# Patient Record
Sex: Female | Born: 1955 | Hispanic: Yes | Marital: Married
Health system: Southern US, Community
[De-identification: ages and names within clinical notes are randomized; demographics above are authoritative.]

## PROBLEM LIST (undated history)

## (undated) DIAGNOSIS — M1990 Unspecified osteoarthritis, unspecified site: Secondary | ICD-10-CM

## (undated) DIAGNOSIS — I10 Essential (primary) hypertension: Secondary | ICD-10-CM

## (undated) DIAGNOSIS — F339 Major depressive disorder, recurrent, unspecified: Secondary | ICD-10-CM

## (undated) DIAGNOSIS — K589 Irritable bowel syndrome without diarrhea: Secondary | ICD-10-CM

## (undated) DIAGNOSIS — N951 Menopausal and female climacteric states: Secondary | ICD-10-CM

## (undated) DIAGNOSIS — K219 Gastro-esophageal reflux disease without esophagitis: Secondary | ICD-10-CM

## (undated) DIAGNOSIS — M65331 Trigger finger, right middle finger: Secondary | ICD-10-CM

## (undated) HISTORY — PX: CHOLECYSTECTOMY: SHX55

---

## 2008-12-06 ENCOUNTER — Ambulatory Visit: Payer: Self-pay | Admitting: Family Medicine

## 2010-09-13 ENCOUNTER — Ambulatory Visit: Payer: Self-pay | Admitting: Family

## 2011-11-20 ENCOUNTER — Ambulatory Visit: Payer: Self-pay

## 2015-08-30 ENCOUNTER — Encounter: Payer: Self-pay | Admitting: *Deleted

## 2015-08-30 ENCOUNTER — Ambulatory Visit: Payer: Self-pay | Attending: Oncology | Admitting: *Deleted

## 2015-08-30 ENCOUNTER — Ambulatory Visit
Admission: RE | Admit: 2015-08-30 | Discharge: 2015-08-30 | Disposition: A | Discharge status: Home / Self Care | Payer: Self-pay | Source: Ambulatory Visit | Attending: Oncology | Admitting: Oncology

## 2015-08-30 VITALS — BP 154/87 | HR 58 | Temp 96.5°F | Ht <= 58 in | Wt 118.2 lb

## 2015-08-30 DIAGNOSIS — Z0000 Encounter for general adult medical examination without abnormal findings: Secondary | ICD-10-CM

## 2015-08-30 DIAGNOSIS — Z1231 Encounter for screening mammogram for malignant neoplasm of breast: Secondary | ICD-10-CM | POA: Insufficient documentation

## 2015-08-30 NOTE — Progress Notes (Signed)
Subjective:     Patient ID: Sherri Mason, female   DOB: 01/07/1956, 59 y.o.   MRN: [id number]  HPI   Review of Systems     Objective:   Physical Exam  Pulmonary/Chest: Right breast exhibits inverted nipple. Right breast exhibits no mass, no nipple discharge, no skin change and no tenderness. Left breast exhibits inverted nipple. Left breast exhibits no mass, no nipple discharge, no skin change and no tenderness. Breasts are symmetrical.  Bilateral nipples are inverted.  Patient states this is normal for her       Assessment:     59 year [facility] Hispanic [facility]  Damian Leavellrudy, the interpreter is present during the interview and exam.  Clinical breast exam unremarkable.  Taught self breast awareness. Patient has been screened for eligibility.  Blood pressure elevated at 157/87.  She is to recheck her blood pressure at [facility] or [facility], and if remains higher than 140/90 she is to follow-up with her primary care provider.  Hand out on hypertention given to patient.She does not have any insurance, Medicare or Medicaid.  She also meets financial eligibility.  Hand-out given on the Affordable Care Act.     Plan:     Screening mammogram ordered.  Will follow up per [facility] protocol.

## 2015-09-04 ENCOUNTER — Encounter: Payer: Self-pay | Admitting: *Deleted

## 2015-09-04 NOTE — Progress Notes (Signed)
Letter mailed to inform patient of her normal mammogram.  To follow-up in one year with next screening.  [facility] [place].

## 2015-09-13 ENCOUNTER — Ambulatory Visit: Payer: Self-pay

## 2015-12-12 ENCOUNTER — Other Ambulatory Visit: Payer: Self-pay | Admitting: Primary Care

## 2015-12-12 DIAGNOSIS — R109 Unspecified abdominal pain: Secondary | ICD-10-CM

## 2015-12-15 ENCOUNTER — Ambulatory Visit
Admission: RE | Admit: 2015-12-15 | Discharge: 2015-12-15 | Disposition: A | Discharge status: Home / Self Care | Payer: BLUE CROSS/BLUE SHIELD | Source: Ambulatory Visit | Attending: Primary Care | Admitting: Primary Care

## 2015-12-15 DIAGNOSIS — R1010 Upper abdominal pain, unspecified: Secondary | ICD-10-CM | POA: Insufficient documentation

## 2015-12-15 DIAGNOSIS — R109 Unspecified abdominal pain: Secondary | ICD-10-CM

## 2015-12-15 DIAGNOSIS — Z9049 Acquired absence of other specified parts of digestive tract: Secondary | ICD-10-CM | POA: Diagnosis not present

## 2017-01-22 ENCOUNTER — Ambulatory Visit: Admit: 2017-01-22 | Payer: BLUE CROSS/BLUE SHIELD | Admitting: Unknown Physician Specialty

## 2017-01-22 SURGERY — EGD (ESOPHAGOGASTRODUODENOSCOPY)
Anesthesia: General

## 2019-05-24 ENCOUNTER — Telehealth: Payer: Self-pay | Admitting: *Deleted

## 2019-05-24 DIAGNOSIS — Z20822 Contact with and (suspected) exposure to covid-19: Secondary | ICD-10-CM

## 2019-05-24 NOTE — Addendum Note (Signed)
Addended by: Dimple Nanas on: 05/24/2019 01:57 PM   Modules accepted: Orders

## 2019-05-24 NOTE — Telephone Encounter (Signed)
Pt called using [employer] ID# [id number]. Pt scheduled for appt on 05/25/19 at [facility] site. Pt advised to wear a mask and to remain in the car at scheduled appt time. Understanding verbalized.

## 2019-05-24 NOTE — Telephone Encounter (Signed)
Pt referred for COVID-19 testing by [facility].  

## 2019-05-25 ENCOUNTER — Other Ambulatory Visit: Payer: BLUE CROSS/BLUE SHIELD

## 2019-05-25 DIAGNOSIS — Z20822 Contact with and (suspected) exposure to covid-19: Secondary | ICD-10-CM

## 2019-05-29 LAB — NOVEL CORONAVIRUS, NAA: SARS-CoV-2, NAA: DETECTED — AB

## 2019-06-04 ENCOUNTER — Emergency Department: Payer: PRIVATE HEALTH INSURANCE

## 2019-06-04 ENCOUNTER — Encounter: Payer: Self-pay | Admitting: Emergency Medicine

## 2019-06-04 ENCOUNTER — Encounter (HOSPITAL_COMMUNITY): Payer: Self-pay | Admitting: Family Medicine

## 2019-06-04 ENCOUNTER — Other Ambulatory Visit: Payer: Self-pay

## 2019-06-04 ENCOUNTER — Emergency Department
Admission: EM | Admit: 2019-06-04 | Discharge: 2019-06-04 | Payer: PRIVATE HEALTH INSURANCE | Attending: Emergency Medicine | Admitting: Emergency Medicine

## 2019-06-04 ENCOUNTER — Inpatient Hospital Stay (HOSPITAL_COMMUNITY)
Admission: AD | Admit: 2019-06-04 | Discharge: 2019-06-09 | DRG: 177 | Disposition: A | Discharge status: Home / Self Care | Payer: PRIVATE HEALTH INSURANCE | Source: Other Acute Inpatient Hospital | Attending: Internal Medicine | Admitting: Internal Medicine

## 2019-06-04 DIAGNOSIS — U071 COVID-19: Secondary | ICD-10-CM | POA: Diagnosis present

## 2019-06-04 DIAGNOSIS — J9621 Acute and chronic respiratory failure with hypoxia: Secondary | ICD-10-CM | POA: Diagnosis not present

## 2019-06-04 DIAGNOSIS — J1289 Other viral pneumonia: Secondary | ICD-10-CM | POA: Diagnosis present

## 2019-06-04 DIAGNOSIS — J80 Acute respiratory distress syndrome: Secondary | ICD-10-CM | POA: Diagnosis present

## 2019-06-04 DIAGNOSIS — F419 Anxiety disorder, unspecified: Secondary | ICD-10-CM | POA: Diagnosis present

## 2019-06-04 DIAGNOSIS — J9601 Acute respiratory failure with hypoxia: Secondary | ICD-10-CM

## 2019-06-04 DIAGNOSIS — R0602 Shortness of breath: Secondary | ICD-10-CM | POA: Diagnosis present

## 2019-06-04 LAB — COMPREHENSIVE METABOLIC PANEL
ALT: 22 U/L (ref 0–44)
AST: 34 U/L (ref 15–41)
Albumin: 3.7 g/dL (ref 3.5–5.0)
Alkaline Phosphatase: 64 U/L (ref 38–126)
Anion gap: 10 (ref 5–15)
BUN: 8 mg/dL (ref 6–20)
CO2: 23 mmol/L (ref 22–32)
Calcium: 9.4 mg/dL (ref 8.9–10.3)
Chloride: 109 mmol/L (ref 98–111)
Creatinine, Ser: 0.46 mg/dL (ref 0.44–1.00)
GFR calc Af Amer: >60 mL/min (ref >60)
GFR calc non Af Amer: >60 mL/min (ref >60)
Glucose, Bld: 120 mg/dL — ABNORMAL HIGH (ref 70–99)
Potassium: 3.8 mmol/L (ref 3.5–5.1)
Sodium: 142 mmol/L (ref 135–145)
Total Bilirubin: 0.6 mg/dL (ref 0.3–1.2)
Total Protein: 7.4 g/dL (ref 6.5–8.1)

## 2019-06-04 LAB — CBC WITH DIFFERENTIAL/PLATELET
Abs Immature Granulocytes: 0.05 10*3/uL (ref 0.00–0.07)
Basophils Absolute: 0 10*3/uL (ref 0.0–0.1)
Basophils Relative: 0 %
Eosinophils Absolute: 0 10*3/uL (ref 0.0–0.5)
Eosinophils Relative: 0 %
HCT: 37.3 % (ref 36.0–46.0)
Hemoglobin: 12.6 g/dL (ref 12.0–15.0)
Immature Granulocytes: 1 %
Lymphocytes Relative: 13 %
Lymphs Abs: 0.9 10*3/uL (ref 0.7–4.0)
MCH: 30.4 pg (ref 26.0–34.0)
MCHC: 33.8 g/dL (ref 30.0–36.0)
MCV: 89.9 fL (ref 80.0–100.0)
Monocytes Absolute: 0.5 10*3/uL (ref 0.1–1.0)
Monocytes Relative: 7 %
Neutro Abs: 5.2 10*3/uL (ref 1.7–7.7)
Neutrophils Relative %: 79 %
Platelets: 360 10*3/uL (ref 150–400)
RBC: 4.15 MIL/uL (ref 3.87–5.11)
RDW: 12.2 % (ref 11.5–15.5)
WBC: 6.7 10*3/uL (ref 4.0–10.5)
nRBC: 0 % (ref 0.0–0.2)

## 2019-06-04 LAB — ABO/RH: ABO/RH(D): O POS

## 2019-06-04 LAB — FERRITIN: Ferritin: 790 ng/mL — ABNORMAL HIGH (ref 11–307)

## 2019-06-04 LAB — BRAIN NATRIURETIC PEPTIDE: B Natriuretic Peptide: 46.2 pg/mL (ref 0.0–100.0)

## 2019-06-04 LAB — D-DIMER, QUANTITATIVE: D-Dimer, Quant: 1.01 ug/mL-FEU — ABNORMAL HIGH (ref 0.00–0.50)

## 2019-06-04 LAB — LACTIC ACID, PLASMA
Lactic Acid, Venous: 1 mmol/L (ref 0.5–1.9)
Lactic Acid, Venous: 1 mmol/L (ref 0.5–1.9)

## 2019-06-04 LAB — FIBRIN DERIVATIVES D-DIMER (ARMC ONLY): Fibrin derivatives D-dimer (ARMC): 953.04 ng/mL (FEU) — ABNORMAL HIGH (ref 0.00–499.00)

## 2019-06-04 LAB — C-REACTIVE PROTEIN
CRP: 7.4 mg/dL — ABNORMAL HIGH (ref <1.0)
CRP: 8 mg/dL — ABNORMAL HIGH (ref <1.0)

## 2019-06-04 LAB — TRIGLYCERIDES: Triglycerides: 218 mg/dL — ABNORMAL HIGH (ref <150)

## 2019-06-04 LAB — LACTATE DEHYDROGENASE: LDH: 215 U/L — ABNORMAL HIGH (ref 98–192)

## 2019-06-04 LAB — PROCALCITONIN
Procalcitonin: <0.1 ng/mL
Procalcitonin: <0.1 ng/mL

## 2019-06-04 LAB — FIBRINOGEN: Fibrinogen: 651 mg/dL — ABNORMAL HIGH (ref 210–475)

## 2019-06-04 MED ORDER — ENOXAPARIN SODIUM 40 MG/0.4ML ~~LOC~~ SOLN
40.0000 mg | SUBCUTANEOUS | Status: DC
  Administered 2019-06-04 – 2019-06-08 (×5): 40 mg via SUBCUTANEOUS
  Filled 2019-06-04 (×5): qty 0.4

## 2019-06-04 MED ORDER — BISACODYL 5 MG PO TBEC: 5.0000 mg | DELAYED_RELEASE_TABLET | Freq: Every day | ORAL | Status: DC | PRN

## 2019-06-04 MED ORDER — HYDROCOD POLST-CPM POLST ER 10-8 MG/5ML PO SUER: 5.0000 mL | Freq: Two times a day (BID) | ORAL | Status: DC | PRN

## 2019-06-04 MED ORDER — SODIUM CHLORIDE 0.9 % IV SOLN
200.0000 mg | Freq: Once | INTRAVENOUS | Status: AC
  Administered 2019-06-04: 200 mg via INTRAVENOUS
  Filled 2019-06-04: qty 40

## 2019-06-04 MED ORDER — ALPRAZOLAM 0.5 MG PO TABS
0.5000 mg | ORAL_TABLET | Freq: Three times a day (TID) | ORAL | Status: DC | PRN
  Administered 2019-06-04 – 2019-06-05 (×3): 0.5 mg via ORAL
  Filled 2019-06-04 (×3): qty 1

## 2019-06-04 MED ORDER — IPRATROPIUM-ALBUTEROL 20-100 MCG/ACT IN AERS
1.0000 | INHALATION_SPRAY | Freq: Four times a day (QID) | RESPIRATORY_TRACT | Status: DC
  Administered 2019-06-04 – 2019-06-09 (×20): 1 via RESPIRATORY_TRACT
  Filled 2019-06-04: qty 4

## 2019-06-04 MED ORDER — TOCILIZUMAB 400 MG/20ML IV SOLN
8.0000 mg/kg | Freq: Once | INTRAVENOUS | Status: AC
  Administered 2019-06-04: 400 mg via INTRAVENOUS
  Filled 2019-06-04: qty 20

## 2019-06-04 MED ORDER — DEXAMETHASONE SODIUM PHOSPHATE 10 MG/ML IJ SOLN
6.0000 mg | Freq: Once | INTRAMUSCULAR | Status: AC
  Administered 2019-06-04: 6 mg via INTRAVENOUS
  Filled 2019-06-04: qty 1

## 2019-06-04 MED ORDER — GUAIFENESIN-DM 100-10 MG/5ML PO SYRP
10.0000 mL | ORAL_SOLUTION | Freq: Four times a day (QID) | ORAL | Status: DC
  Administered 2019-06-04 – 2019-06-08 (×14): 10 mL via ORAL
  Filled 2019-06-04 (×15): qty 10

## 2019-06-04 MED ORDER — VITAMIN C 500 MG PO TABS
500.0000 mg | ORAL_TABLET | Freq: Every day | ORAL | Status: DC
  Administered 2019-06-04 – 2019-06-09 (×6): 500 mg via ORAL
  Filled 2019-06-04 (×6): qty 1

## 2019-06-04 MED ORDER — ONDANSETRON HCL 4 MG/2ML IJ SOLN: 4.0000 mg | Freq: Four times a day (QID) | INTRAMUSCULAR | Status: DC | PRN

## 2019-06-04 MED ORDER — ZINC SULFATE 220 (50 ZN) MG PO CAPS
220.0000 mg | ORAL_CAPSULE | Freq: Every day | ORAL | Status: DC
  Administered 2019-06-04 – 2019-06-09 (×6): 220 mg via ORAL
  Filled 2019-06-04 (×6): qty 1

## 2019-06-04 MED ORDER — METHYLPREDNISOLONE SODIUM SUCC 40 MG IJ SOLR
40.0000 mg | Freq: Three times a day (TID) | INTRAMUSCULAR | Status: DC
  Administered 2019-06-04 – 2019-06-05 (×2): 40 mg via INTRAVENOUS
  Filled 2019-06-04 (×2): qty 1

## 2019-06-04 MED ORDER — TRAZODONE HCL 50 MG PO TABS: 50.0000 mg | ORAL_TABLET | Freq: Every evening | ORAL | Status: DC | PRN

## 2019-06-04 MED ORDER — ACETAMINOPHEN 325 MG PO TABS
650.0000 mg | ORAL_TABLET | Freq: Four times a day (QID) | ORAL | Status: DC | PRN
  Administered 2019-06-04 – 2019-06-05 (×3): 650 mg via ORAL
  Filled 2019-06-04 (×3): qty 2

## 2019-06-04 MED ORDER — HYDROCODONE-ACETAMINOPHEN 5-325 MG PO TABS: 1.0000 | ORAL_TABLET | ORAL | Status: DC | PRN

## 2019-06-04 MED ORDER — GUAIFENESIN-DM 100-10 MG/5ML PO SYRP: 10.0000 mL | ORAL_SOLUTION | ORAL | Status: DC | PRN

## 2019-06-04 MED ORDER — SENNOSIDES-DOCUSATE SODIUM 8.6-50 MG PO TABS: 1.0000 | ORAL_TABLET | Freq: Every evening | ORAL | Status: DC | PRN

## 2019-06-04 MED ORDER — DOCUSATE SODIUM 100 MG PO CAPS
100.0000 mg | ORAL_CAPSULE | Freq: Two times a day (BID) | ORAL | Status: DC
  Administered 2019-06-04 – 2019-06-09 (×10): 100 mg via ORAL
  Filled 2019-06-04 (×10): qty 1

## 2019-06-04 MED ORDER — ONDANSETRON HCL 4 MG PO TABS: 4.0000 mg | ORAL_TABLET | Freq: Four times a day (QID) | ORAL | Status: DC | PRN

## 2019-06-04 MED ORDER — SODIUM CHLORIDE 0.9 % IV SOLN
100.0000 mg | INTRAVENOUS | Status: AC
  Administered 2019-06-05 – 2019-06-08 (×4): 100 mg via INTRAVENOUS
  Filled 2019-06-04 (×4): qty 20

## 2019-06-04 NOTE — ED Provider Notes (Signed)
[facility] Emergency Department Provider Note   ____________________________________________    I have reviewed the triage vital signs and the nursing notes.   HISTORY  Chief Complaint Shortness of Breath and Chest Pain     HPI Sherri Mason is a 63 y.o. female with known COVID-19 who presents with worsening shortness of breath, chest discomfort.  Patient reports that she is feeling increasing shortness of breath, feels very weak and has no one to take care of her at home.  Was seen at [facility] emergency department twice in the last 4 days per records.  She notes that she is getting weaker every day.  Was diagnosed with novel coronavirus on the 11th  History reviewed. No pertinent past medical history.  There are no active problems to display for this patient.   History reviewed. No pertinent surgical history.  Prior to Admission medications   Not on File     Allergies Patient has no known allergies.  Family History  Problem Relation Age of Onset  . Breast cancer Neg Hx     Social History Does not smoke, no alcohol use Review of Systems  Constitutional: Positive fevers Eyes: No visual changes.  ENT: Mouth feels dry Cardiovascular: Chest tightness Respiratory: As above Gastrointestinal: No abdominal pain.  No nausea, no vomiting.   Genitourinary: Negative for dysuria. Musculoskeletal: Negative for back pain. Skin: Negative for rash. Neurological: Negative for headaches or weakness   ____________________________________________   PHYSICAL EXAM:  VITAL SIGNS: ED Triage Vitals  Enc Vitals Group     BP 06/04/19 1343 (!) 153/77     Pulse Rate 06/04/19 1343 93     Resp 06/04/19 1343 (!) 26     Temp 06/04/19 1343 99.3 F (37.4 C)     Temp Source 06/04/19 1343 Oral     SpO2 06/04/19 1343 93 %     Weight 06/04/19 1345 49.9 kg (110 lb)     Height 06/04/19 1345 1.524 m (5')     Head Circumference --      Peak Flow --       Pain Score 06/04/19 1345 9     Pain Loc --      Pain Edu? --      Excl. in [place]? --     Constitutional: Alert and oriented.  Eyes: Conjunctivae are normal.   Nose: No congestion/rhinnorhea. Mouth/Throat: Mucous membranes are moist.    Cardiovascular: Normal rate, regular rhythm. Grossly normal heart sounds.  Good peripheral circulation. Respiratory: Tachypnea, no retractions.  Rales bilateral Gastrointestinal: Soft and nontender. No distention.  No CVA tenderness.  Musculoskeletal: No lower extremity tenderness nor edema.  Warm and well perfused Neurologic:  Normal speech and language. No gross focal neurologic deficits are appreciated.  Skin:  Skin is warm, dry and intact. No rash noted. Psychiatric: Mood and affect are normal. Speech and behavior are normal.  ____________________________________________   LABS (all labs ordered are listed, but only abnormal results are displayed)  Labs Reviewed  CULTURE, BLOOD (ROUTINE X 2)  CULTURE, BLOOD (ROUTINE X 2)  LACTIC ACID, PLASMA  LACTIC ACID, PLASMA  CBC WITH DIFFERENTIAL/PLATELET  COMPREHENSIVE METABOLIC PANEL  FIBRIN DERIVATIVES D-DIMER ([facility] ONLY)  PROCALCITONIN  LACTATE DEHYDROGENASE  FERRITIN  TRIGLYCERIDES  FIBRINOGEN  C-REACTIVE PROTEIN  POC URINE PREG, ED   ____________________________________________  EKG   ____________________________________________  RADIOLOGY  Chest x-ray demonstrates bilateral patchy airspace opacities, viewed by me ____________________________________________   PROCEDURES  Procedure(s) performed: No  Procedures   Critical Care performed: yes  CRITICAL CARE Performed by: Jene Everyobert Christin M[place]   Total critical care time: 30 minutes  Critical care time was exclusive of separately billable procedures and treating other patients.  Critical care was necessary to treat or prevent imminent or life-threatening deterioration.  Critical care was time spent personally by me on the  following activities: development of treatment plan with patient and/or surrogate as well as nursing, discussions with consultants, evaluation of patient's response to treatment, examination of patient, obtaining history from patient or surrogate, ordering and performing treatments and interventions, ordering and review of laboratory studies, ordering and review of radiographic studies, pulse oximetry and re-evaluation of patient's condition.  ____________________________________________   INITIAL IMPRESSION / ASSESSMENT AND PLAN / ED COURSE  Pertinent labs & imaging results that were available during my care of the patient were reviewed by me and considered in my medical decision making (see chart for details).  Patient presents with worsening shortness of breath with a diagnosis of COVID-19.  Chest x-ray appears to be worsening, she becomes  hypoxic to 86% with any ambulation and short of breath.  While lying in bed she is satting in the low 90s but is markedly tachypneic.  Improves with O2.  Has little support at home.  I suspect she will require admission given her oxygen requirement   [facility]el [facility], accepts transfer.    ____________________________________________   FINAL CLINICAL IMPRESSION(S) / ED DIAGNOSES  Final diagnoses:  COVID-19  Acute respiratory failure with hypoxia [facility](HCC)        Note:  This document was prepared using Dragon voice recognition software and may include unintentional dictation errors.   Jene EveryKinner, Anelise Staron, MD 06/04/19 1451

## 2019-06-04 NOTE — H&P (Signed)
History and Physical   Patient: Sherri Mason                            PCP: Sandrea Hughsubio, Jessica, NP                    DOB: 06/22/1956            DOA: 06/04/2019 ZOX:[id number]             DOS: 06/04/2019, 7:44 PM  Patient coming from:   Home, answer from [place] I have personally reviewed patient's medical records, in electronic medical records, including: [place] [facility], and care everywhere.    Chief Complaint:   Shortness of breath, hypoxia  History of present illness:    Bennett Sherri Mason Mason is a 63 y.o. female with no significant past medical history  Presented in [place] in acute respiratory distress hypoxic due to COVID 19 infection.  Patient had a positive COVID-19 infection, has been apparently [facility] for respiratory distress per records did not meet the criteria for admission.  Patient's initial positive COVID-19 test was on the July 11th, 2020, She has been progressively getting worse with shortness of breath.  [facility] at [facility] patient was seen again was satting 86% on room air and worsening with exertion tachypneic with aspiratory rate 30s-40s.  Improved to 92% on 2 L by nasal cannula.   Patient reporting of subjective fever, chills, dry cough, shortness of breath.   Patient Denies having: Chest Pain, Abd pain, N/V/D, headache, dizziness, lightheadedness, joint pain, rash, open wounds    ED Course:   Patient was [facility] at [facility] ED, was satting 86% on room air, improved to low 90s, 92% with 2 L of oxygen, apparently was de-satting with further exertion. CBC CMP were obtained within normal limits LDH 215, triglyceride 218, ferritin 790, lactic acid 1.0, procalcitonin level <0.10 x2, BBC 6.7, fibrinogen 651, d-dimer 953.04 SARS-CoV-2, NAA Not Detected Detected Abnormal     Blood cultures were obtained at [place]  Review of Systems: As per HPI, otherwise 10 point review of systems were negative.    ----------------------------------------------------------------------------------------------------------------------  No Known Allergies  Home MEDs:  Prior to Admission medications   Not on File    PRN MEDs: acetaminophen, bisacodyl, chlorpheniramine-HYDROcodone, HYDROcodone-acetaminophen, ondansetron **OR** ondansetron (ZOFRAN) IV, senna-docusate, traZODone  History reviewed. No pertinent past medical history.  History reviewed. No pertinent surgical history.   has no history on file for tobacco, alcohol, and drug.   Family History  Problem Relation Age of Onset  . Breast cancer Neg Hx     Physical Exam:   Vitals:   06/04/19 1922  BP: (!) 158/98  Pulse: 74  Resp: (!) 35  Temp: 98.6 F (37 C)  TempSrc: Oral  SpO2: 98%   Constitutional: Mild distress shortness of breath, cough on O2 via nasal cannula high flow Eyes: PERRL, lids and conjunctivae normal ENMT: Mucous membranes are moist. Posterior pharynx clear of any exudate or lesions.Normal dentition.  Neck: normal, supple, no masses, no thyromegaly Respiratory: Bilateral rhonchi few's mild-moderate wheezing, no crackles.  Increased respiratory effort.  To moderate use of accessory muscle use.  Cardiovascular: Regular rate and rhythm, no murmurs / rubs / gallops. No extremity edema. 2+ pedal pulses. No carotid bruits.  Abdomen: no tenderness, no masses palpated. No hepatosplenomegaly. Bowel sounds positive.  Musculoskeletal: no clubbing / cyanosis. No joint deformity upper and lower extremities. Good ROM,  no contractures. Normal muscle tone.  Neurologic: CN II-XII grossly intact. Sensation intact, DTR normal. Strength 5/5 in all 4.  Psychiatric: Normal judgment and insight. Alert and oriented x 3. Normal mood.  Skin: no rashes, lesions, ulcers. No induration   Labs on admission:    I have personally reviewed following labs and imaging studies  CBC: Recent Labs  Lab 06/04/19 1408  WBC 6.7  NEUTROABS 5.2  HGB  12.6  HCT 37.3  MCV 89.9  PLT 789   Basic Metabolic Panel: Recent Labs  Lab 06/04/19 1408  NA 142  K 3.8  CL 109  CO2 23  GLUCOSE 120*  BUN 8  CREATININE 0.46  CALCIUM 9.4   GFR: Estimated Creatinine Clearance: 53.7 mL/min (by C-G formula based on SCr of 0.46 mg/dL). Liver Function Tests: Recent Labs  Lab 06/04/19 1408  AST 34  ALT 22  ALKPHOS 64  BILITOT 0.6  PROT 7.4  ALBUMIN 3.7  Lipid Profile: Recent Labs    06/04/19 1408  TRIG 218*   Thyroid Function Tests: No results for input(s): TSH, T4TOTAL, FREET4, T3FREE, THYROIDAB in the last 72 hours. Anemia Panel: Recent Labs    06/04/19 1408  FERRITIN 790*   Urine analysis: No results found for: COLORURINE, APPEARANCEUR, LABSPEC, PHURINE, GLUCOSEU, HGBUR, BILIRUBINUR, KETONESUR, PROTEINUR, UROBILINOGEN, NITRITE, LEUKOCYTESUR   Radiologic Exams on Admission:   Dg Chest Port 1 View  Result Date: 06/04/2019 CLINICAL DATA:  Shortness of breath. EXAM: PORTABLE CHEST 1 VIEW COMPARISON:  None. FINDINGS: The heart size and mediastinal contours are within normal limits. No pneumothorax or pleural effusion is noted. Multiple patchy airspace opacities are noted throughout both lungs. The visualized skeletal structures are unremarkable. IMPRESSION: Bilateral patchy airspace opacities are noted consistent with multifocal pneumonia. Electronically Signed   By: Marijo Conception M.D.   On: 06/04/2019 14:20    EKG:   Independently reviewed.   Orders placed or performed during the hospital encounter of 06/04/19  . ED EKG 12-Lead  . ED EKG 12-Lead   --------------------------------------------------------------------------------------------------------------------------    Assessment / Plan:   Principal Problem:   Acute respiratory distress syndrome (ARDS) due to COVID-19 virus Active Problems:   COVID-19 virus infection    Acute respiratory distress syndrome (ARDS) due to COVID-19 virus -Continue supplement  oxygen, high flow, to maintain O2 sat greater than 92% -LDH 215, triglyceride 218, ferritin 790, lactic acid 1.0, procalcitonin level <0.10 x2, BBC 6.7, fibrinogen 651, d-dimer 953.04 -We will continue to monitor markers LDH, ferritin, C-reactive protein, -Patient meets the criteria for Ramdesivire and Actemra -we will consult pharmacy to recheck the criteria and initiate dosage -Initiating IV steroids, continue scheduled inhalers, and PRN   ??  Pneumonia -Chest x-rays were reviewed -05/1419 from [facility] patient was treated with Levaquin 750 mg x 5 days -Patient's procalcitonin level is within normal limits, will repeating, chest x-ray reporting pneumonia but patient has no, fever, no  leukocytosis -will abstain from antibiotic use at this time.  Anxiety -Likely due to respiratory distress -Continue to monitor, PRN Xanax   DVT prophylaxis: SCD/Compression stockings and Heparin SQ  Code Status:   Code Status: Full Code  Family Communication:  The above findings and plan of care has been discussed with patient and family in detail, they expressed understanding and agreement of above plan.   Called the patient's daughter Ms. Laqueta Jean, at [phone] confirmed patient history she is not on any medication at home no chronic issues.  Firm progressive respiratory distress, cough, recent  medication for possible pneumonia was completed. Patient is confirmed non-smoker does not drink.  No chronic medical issues  Above finding plan of care was discussed with her in detail.  She has expressed understanding and agreement above plan.  Disposition Plan: >5 days  Consults called:  None  Admission status: Patient will be admitted as Inpatient, with a greater than 2 midnight length of stay.   Cultures:  -Sugars were obtained and elements Antimicrobial: -None   ---------------------------------------------------------------------------------------------------------------------------------------------------------------------------------------------------------------------------------------  Time spent: > than  55  Min.   SIGNED: Kendell BaneSeyed A Thomasa Heidler, MD, FACP, FHM. [facility],  Pager [phone]  If 7PM-7AM, please contact night-coverage [email or web address], Password [email or web address] 06/04/2019, 7:44 PM

## 2019-06-04 NOTE — ED Notes (Signed)
Pt transferred to [place] by [facility].

## 2019-06-04 NOTE — ED Notes (Signed)
EMTALA Reviewed 

## 2019-06-04 NOTE — Progress Notes (Signed)
Pharmacy Note - Remdesivir Dosing  O:  ALT: 22 CXR: Bilateral patchy airspace opacities c/w multifocal pneumonia. SpO2: initially 86% on RA, now 98% on 2L   A/P:  Patient meets criteria for remdesivir.  Begin remdesivir 200 mg IV x 1, followed by 100 mg IV daily x 4 days  Monitor ALT, clinical progress  Peggyann Juba, PharmD, [place] [phone] 06/04/2019 7:52 PM

## 2019-06-04 NOTE — ED Triage Notes (Signed)
PT arrives with increased shortness of breath and chest pain. Pt diagnosed with COVID 19 12 days prior. PT very weak in triage. Pt respiratory rate 26-32 in triage. Room air saturation 93%.

## 2019-06-04 NOTE — ED Notes (Signed)
Pt handed this nurse the phone to speak with her daughter. Daughter questioned whether she was going to be admitted and why; also whether or not pt really needed to be admitted. Explained that pt has been in ER 3 times in last 3-4 days and she states she can't take care of herself and no one is at home to take care of her. Daughter's name and number added to contact list.

## 2019-06-04 NOTE — Progress Notes (Signed)
[facility] Plan of Care Note  Patient: Sherri Mason    VOJ:[id number]  PCP: Freddy Finner, NP    DOB: 01-23-1956  DOS: 06/04/2019   Received a phone call from Facility: [facility] ED regarding transfer of Ms. Sherri Mason. Requesting: Dr. Corky Downs Reason for transfer: COVID positive with hypoxic respiratory failure History: Recently diagnosed at [facility] ED for COVID-19 infection.  Since the patient did not have any indication for admission patient was sent home.  Came back with worsening shortness of breath and still was not making any admission criteria. Currently back in the ED with worsening fatigue shortness of breath and cough.  Per EDP hypoxic 86% on exertion.  Chest x-ray shows bilateral fluffy infiltrates.  Significantly tachypneic on examination.  Vitals: Respiratory rate in 30s and 40s.  86% on room air on exertion, 92% on 2 LPM. Labs: Negative procalcitonin COVID-19 Labs  Recent Labs    06/04/19 1408  FERRITIN 790*  LDH 215*    Lab Results  Component Value Date   SARSCOV2NAA Detected (A) 05/25/2019   CRP pending. Treatment received: IV Decadron 6 mg x 1.  Plan of care: The patient will be accepted for admission to [facility], telemetry unit.  Author: Berle Mull, MD [facility] 06/04/2019  If 7PM-7AM, please contact night-coverage at [email or web address],

## 2019-06-05 ENCOUNTER — Encounter (HOSPITAL_COMMUNITY): Payer: Self-pay | Admitting: *Deleted

## 2019-06-05 ENCOUNTER — Inpatient Hospital Stay: Payer: Self-pay

## 2019-06-05 DIAGNOSIS — J9621 Acute and chronic respiratory failure with hypoxia: Secondary | ICD-10-CM

## 2019-06-05 LAB — COMPREHENSIVE METABOLIC PANEL
ALT: 26 U/L (ref 0–44)
AST: 36 U/L (ref 15–41)
Albumin: 3.2 g/dL — ABNORMAL LOW (ref 3.5–5.0)
Alkaline Phosphatase: 60 U/L (ref 38–126)
Anion gap: 6 (ref 5–15)
BUN: 13 mg/dL (ref 6–20)
CO2: 26 mmol/L (ref 22–32)
Calcium: 9.3 mg/dL (ref 8.9–10.3)
Chloride: 107 mmol/L (ref 98–111)
Creatinine, Ser: 0.47 mg/dL (ref 0.44–1.00)
GFR calc Af Amer: >60 mL/min (ref >60)
GFR calc non Af Amer: >60 mL/min (ref >60)
Glucose, Bld: 178 mg/dL — ABNORMAL HIGH (ref 70–99)
Potassium: 3.9 mmol/L (ref 3.5–5.1)
Sodium: 139 mmol/L (ref 135–145)
Total Bilirubin: 0.3 mg/dL (ref 0.3–1.2)
Total Protein: 6.9 g/dL (ref 6.5–8.1)

## 2019-06-05 LAB — CK: Total CK: 31 U/L — ABNORMAL LOW (ref 38–234)

## 2019-06-05 LAB — CBC WITH DIFFERENTIAL/PLATELET
Abs Immature Granulocytes: 0.02 10*3/uL (ref 0.00–0.07)
Basophils Absolute: 0 10*3/uL (ref 0.0–0.1)
Basophils Relative: 0 %
Eosinophils Absolute: 0 10*3/uL (ref 0.0–0.5)
Eosinophils Relative: 0 %
HCT: 35.1 % — ABNORMAL LOW (ref 36.0–46.0)
Hemoglobin: 12 g/dL (ref 12.0–15.0)
Immature Granulocytes: 1 %
Lymphocytes Relative: 19 %
Lymphs Abs: 0.5 10*3/uL — ABNORMAL LOW (ref 0.7–4.0)
MCH: 30.8 pg (ref 26.0–34.0)
MCHC: 34.2 g/dL (ref 30.0–36.0)
MCV: 90.2 fL (ref 80.0–100.0)
Monocytes Absolute: 0.1 10*3/uL (ref 0.1–1.0)
Monocytes Relative: 2 %
Neutro Abs: 2.2 10*3/uL (ref 1.7–7.7)
Neutrophils Relative %: 78 %
Platelets: 382 10*3/uL (ref 150–400)
RBC: 3.89 MIL/uL (ref 3.87–5.11)
RDW: 12.1 % (ref 11.5–15.5)
WBC: 2.9 10*3/uL — ABNORMAL LOW (ref 4.0–10.5)
nRBC: 0 % (ref 0.0–0.2)

## 2019-06-05 LAB — HEMOGLOBIN A1C
Hgb A1c MFr Bld: 5.5 % (ref 4.8–5.6)
Mean Plasma Glucose: 111.15 mg/dL

## 2019-06-05 LAB — GLUCOSE, CAPILLARY
Glucose-Capillary: 163 mg/dL — ABNORMAL HIGH (ref 70–99)
Glucose-Capillary: 168 mg/dL — ABNORMAL HIGH (ref 70–99)
Glucose-Capillary: 199 mg/dL — ABNORMAL HIGH (ref 70–99)

## 2019-06-05 LAB — C-REACTIVE PROTEIN: CRP: 7.6 mg/dL — ABNORMAL HIGH (ref <1.0)

## 2019-06-05 LAB — D-DIMER, QUANTITATIVE: D-Dimer, Quant: 0.63 ug/mL-FEU — ABNORMAL HIGH (ref 0.00–0.50)

## 2019-06-05 LAB — TRIGLYCERIDES: Triglycerides: 144 mg/dL (ref <150)

## 2019-06-05 LAB — PHOSPHORUS: Phosphorus: 3.7 mg/dL (ref 2.5–4.6)

## 2019-06-05 LAB — FERRITIN: Ferritin: 644 ng/mL — ABNORMAL HIGH (ref 11–307)

## 2019-06-05 LAB — HIV ANTIBODY (ROUTINE TESTING W REFLEX): HIV Screen 4th Generation wRfx: NONREACTIVE

## 2019-06-05 LAB — MAGNESIUM: Magnesium: 2.1 mg/dL (ref 1.7–2.4)

## 2019-06-05 MED ORDER — INSULIN ASPART 100 UNIT/ML ~~LOC~~ SOLN
0.0000 [IU] | Freq: Three times a day (TID) | SUBCUTANEOUS | Status: DC
  Administered 2019-06-05: 2 [IU] via SUBCUTANEOUS
  Administered 2019-06-06 (×3): 1 [IU] via SUBCUTANEOUS
  Administered 2019-06-07 – 2019-06-08 (×2): 2 [IU] via SUBCUTANEOUS
  Administered 2019-06-08 – 2019-06-09 (×2): 1 [IU] via SUBCUTANEOUS

## 2019-06-05 MED ORDER — INSULIN ASPART 100 UNIT/ML ~~LOC~~ SOLN
0.0000 [IU] | Freq: Every day | SUBCUTANEOUS | Status: DC
  Administered 2019-06-06: 2 [IU] via SUBCUTANEOUS

## 2019-06-05 MED ORDER — INSULIN ASPART 100 UNIT/ML ~~LOC~~ SOLN
3.0000 [IU] | Freq: Three times a day (TID) | SUBCUTANEOUS | Status: DC
  Administered 2019-06-06 – 2019-06-09 (×11): 3 [IU] via SUBCUTANEOUS

## 2019-06-05 MED ORDER — DEXAMETHASONE 6 MG PO TABS
6.0000 mg | ORAL_TABLET | Freq: Every day | ORAL | Status: DC
  Administered 2019-06-05 – 2019-06-09 (×5): 6 mg via ORAL
  Filled 2019-06-05 (×5): qty 1

## 2019-06-05 MED ORDER — INSULIN DETEMIR 100 UNIT/ML ~~LOC~~ SOLN
5.0000 [IU] | Freq: Every day | SUBCUTANEOUS | Status: DC
  Administered 2019-06-05 – 2019-06-09 (×5): 5 [IU] via SUBCUTANEOUS
  Filled 2019-06-05 (×5): qty 0.05

## 2019-06-05 NOTE — Progress Notes (Signed)
[facility] PROGRESS NOTE    Progress Note  Sherri Mason  RUE:[id number] DOB: 10/29/1956 DOA: 06/04/2019 PCP: Sherri Mason, Jessica, NP     Brief Narrative:   Sherri Mason is an 63 y.o. female no past medical history presents with elements of acute respiratory distress due to COVID-19.  Patient is also positive on 05/29/2019 and has been progressively getting short of breath.  In the ED she was found to have be hypoxic reading 30 times per minute she was started on 2 L of oxygen.  Assessment/Plan:   Acute on chronic respiratory failure with hypoxia due to COVID-19 virus infection Procalcitonin was less than 0.1 Inflammatory markers were significantly elevated on admission. He was started on IV steroids, IV Remdesivir and Actemra.  With mild improvement in her inflammatory markers. She relates her breathing is improved compared to yesterday. The patient preferably 16 hours today.   DVT prophylaxis: lovenox Family Communication:none Disposition Plan/Barrier to D/C: once off oxygen Code Status:     Code Status Orders  (From admission, onward)         Start     Ordered   06/04/19 1932  Full code  Continuous     06/04/19 1938        Code Status History    This patient has a current code status but no historical code status.   Advance Care Planning Activity        IV Access:    Peripheral IV   Procedures and diagnostic studies:   Dg Chest Port 1 View  Result Date: 06/04/2019 CLINICAL DATA:  Shortness of breath. EXAM: PORTABLE CHEST 1 VIEW COMPARISON:  None. FINDINGS: The heart size and mediastinal contours are within normal limits. No pneumothorax or pleural effusion is noted. Multiple patchy airspace opacities are noted throughout both lungs. The visualized skeletal structures are unremarkable. IMPRESSION: Bilateral patchy airspace opacities are noted consistent with multifocal pneumonia. Electronically Signed   By: Lupita RaiderJames  Green Mason M.D.    On: 06/04/2019 14:20     Medical Consultants:    None.  Anti-Infectives:   None  Subjective:    Sherri Mason relates her breathing is better compared to yesterday.  Objective:    Vitals:   06/04/19 1922 06/05/19 0152 06/05/19 0153 06/05/19 0520  BP: (!) 158/98   116/68  Pulse: 74   62  Resp: (!) 35   (!) 22  Temp: 98.6 F (37 C)   98.7 F (37.1 C)  TempSrc: Oral   Oral  SpO2: 98%   96%  Weight:   49.9 kg   Height:  5' (1.524 m)     SpO2: 96 % O2 Flow Rate (L/min): 2 L/min   Intake/Output Summary (Last 24 hours) at 06/05/2019 0658 Last data filed at 06/05/2019 0500 Gross per 24 hour  Intake 0 ml  Output --  Net 0 ml   Filed Weights   06/05/19 0153  Weight: 49.9 kg    Exam: General exam: In no acute distress. Respiratory system: Good air movement and with diffuse crackles bilaterally. Cardiovascular system: S1 & S2 heard, RRR. No JVD. Gastrointestinal system: Abdomen is nondistended, soft and nontender.  Central nervous system: Alert and oriented. No focal neurological deficits. Extremities: No pedal edema. Skin: No rashes, lesions or ulcers Psychiatry: Judgement and insight appear normal. Mood & affect appropriate.    Data Reviewed:    Labs: Basic Metabolic Panel: Recent Labs  Lab 06/04/19 1408 06/05/19 0311  NA 142 139  K  3.8 3.9  CL 109 107  CO2 23 26  GLUCOSE 120* 178*  BUN 8 13  CREATININE 0.46 0.47  CALCIUM 9.4 9.3  MG  --  2.1  PHOS  --  3.7   GFR Estimated Creatinine Clearance: 53.7 mL/min (by C-G formula based on SCr of 0.47 mg/dL). Liver Function Tests: Recent Labs  Lab 06/04/19 1408 06/05/19 0311  AST 34 36  ALT 22 26  ALKPHOS 64 60  BILITOT 0.6 0.3  PROT 7.4 6.9  ALBUMIN 3.7 3.2*   No results for input(s): LIPASE, AMYLASE in the last 168 hours. No results for input(s): AMMONIA in the last 168 hours. Coagulation profile No results for input(s): INR, PROTIME in the last 168 hours. COVID-19  Labs  Recent Labs    06/04/19 1408 06/04/19 2010 06/05/19 0311  DDIMER  --  1.01* 0.63*  FERRITIN 790*  --  644*  LDH 215*  --   --   CRP 7.4* 8.0* 7.6*    Lab Results  Component Value Date   SARSCOV2NAA Detected (A) 05/25/2019    CBC: Recent Labs  Lab 06/04/19 1408 06/05/19 0311  WBC 6.7 2.9*  NEUTROABS 5.2 PENDING  HGB 12.6 12.0  HCT 37.3 35.1*  MCV 89.9 90.2  PLT 360 382   Cardiac Enzymes: Recent Labs  Lab 06/05/19 0311  CKTOTAL 31*   BNP (last 3 results) No results for input(s): PROBNP in the last 8760 hours. CBG: No results for input(s): GLUCAP in the last 168 hours. D-Dimer: Recent Labs    06/04/19 2010 06/05/19 0311  DDIMER 1.01* 0.63*   Hgb A1c: No results for input(s): HGBA1C in the last 72 hours. Lipid Profile: Recent Labs    06/04/19 1408 06/05/19 0311  TRIG 218* 144   Thyroid function studies: No results for input(s): TSH, T4TOTAL, T3FREE, THYROIDAB in the last 72 hours.  Invalid input(s): FREET3 Anemia work up: Recent Labs    06/04/19 1408 06/05/19 0311  FERRITIN 790* 644*   Sepsis Labs: Recent Labs  Lab 06/04/19 1408 06/04/19 1559 06/04/19 2010 06/05/19 0311  PROCALCITON <0.10  --  <0.10  --   WBC 6.7  --   --  2.9*  LATICACIDVEN 1.0 1.0  --   --    Microbiology No results found for this or any previous visit (from the past 240 hour(s)).   Medications:    docusate sodium  100 mg Oral BID   enoxaparin (LOVENOX) injection  40 mg Subcutaneous Q24H   guaiFENesin-dextromethorphan  10 mL Oral Q6H   Ipratropium-Albuterol  1 puff Inhalation Q6H   methylPREDNISolone (SOLU-MEDROL) injection  40 mg Intravenous Q8H   vitamin C  500 mg Oral Daily   zinc sulfate  220 mg Oral Daily   Continuous Infusions:  remdesivir 100 mg in NS 250 mL       LOS: 1 day   Sherri Mason  [facility]  06/05/2019, 6:58 AM

## 2019-06-06 ENCOUNTER — Encounter (HOSPITAL_COMMUNITY): Payer: Self-pay

## 2019-06-06 LAB — COMPREHENSIVE METABOLIC PANEL
ALT: 27 U/L (ref 0–44)
AST: 29 U/L (ref 15–41)
Albumin: 3.1 g/dL — ABNORMAL LOW (ref 3.5–5.0)
Alkaline Phosphatase: 63 U/L (ref 38–126)
Anion gap: 10 (ref 5–15)
BUN: 17 mg/dL (ref 6–20)
CO2: 22 mmol/L (ref 22–32)
Calcium: 8.8 mg/dL — ABNORMAL LOW (ref 8.9–10.3)
Chloride: 108 mmol/L (ref 98–111)
Creatinine, Ser: 0.48 mg/dL (ref 0.44–1.00)
GFR calc Af Amer: >60 mL/min (ref >60)
GFR calc non Af Amer: >60 mL/min (ref >60)
Glucose, Bld: 165 mg/dL — ABNORMAL HIGH (ref 70–99)
Potassium: 4 mmol/L (ref 3.5–5.1)
Sodium: 140 mmol/L (ref 135–145)
Total Bilirubin: 0.3 mg/dL (ref 0.3–1.2)
Total Protein: 6.3 g/dL — ABNORMAL LOW (ref 6.5–8.1)

## 2019-06-06 LAB — CBC WITH DIFFERENTIAL/PLATELET
Abs Immature Granulocytes: 0.14 10*3/uL — ABNORMAL HIGH (ref 0.00–0.07)
Basophils Absolute: 0 10*3/uL (ref 0.0–0.1)
Basophils Relative: 0 %
Eosinophils Absolute: 0 10*3/uL (ref 0.0–0.5)
Eosinophils Relative: 0 %
HCT: 32.8 % — ABNORMAL LOW (ref 36.0–46.0)
Hemoglobin: 10.9 g/dL — ABNORMAL LOW (ref 12.0–15.0)
Immature Granulocytes: 1 %
Lymphocytes Relative: 6 %
Lymphs Abs: 0.9 10*3/uL (ref 0.7–4.0)
MCH: 30 pg (ref 26.0–34.0)
MCHC: 33.2 g/dL (ref 30.0–36.0)
MCV: 90.4 fL (ref 80.0–100.0)
Monocytes Absolute: 0.7 10*3/uL (ref 0.1–1.0)
Monocytes Relative: 4 %
Neutro Abs: 13.5 10*3/uL — ABNORMAL HIGH (ref 1.7–7.7)
Neutrophils Relative %: 89 %
Platelets: 420 10*3/uL — ABNORMAL HIGH (ref 150–400)
RBC: 3.63 MIL/uL — ABNORMAL LOW (ref 3.87–5.11)
RDW: 12 % (ref 11.5–15.5)
WBC: 15.2 10*3/uL — ABNORMAL HIGH (ref 4.0–10.5)
nRBC: 0 % (ref 0.0–0.2)

## 2019-06-06 LAB — INTERLEUKIN-6, PLASMA: Interleukin-6, Plasma: 6.4 pg/mL (ref 0.0–12.2)

## 2019-06-06 LAB — GLUCOSE, CAPILLARY
Glucose-Capillary: 142 mg/dL — ABNORMAL HIGH (ref 70–99)
Glucose-Capillary: 123 mg/dL — ABNORMAL HIGH (ref 70–99)
Glucose-Capillary: 138 mg/dL — ABNORMAL HIGH (ref 70–99)

## 2019-06-06 LAB — C-REACTIVE PROTEIN: CRP: 3.6 mg/dL — ABNORMAL HIGH (ref <1.0)

## 2019-06-06 LAB — D-DIMER, QUANTITATIVE: D-Dimer, Quant: <0.27 ug/mL-FEU (ref 0.00–0.50)

## 2019-06-06 LAB — FERRITIN: Ferritin: 644 ng/mL — ABNORMAL HIGH (ref 11–307)

## 2019-06-06 NOTE — Plan of Care (Signed)

## 2019-06-06 NOTE — Progress Notes (Signed)
[facility] PROGRESS NOTE    Progress Note  Sherri Mason  RUE:[id number] DOB: 08/18/1956 DOA: 06/04/2019 PCP: Sherri Mason, Jessica, NP     Brief Narrative:   Sherri OfficerHermelinda Lopez Mason is an 63 y.o. female no past medical history presents with elements of acute respiratory distress due to COVID-19.  Patient is also positive on 05/29/2019 and has been progressively getting short of breath.  In the ED she was found to have be hypoxic reading 30 times per minute she was started on 2 L of oxygen.  Assessment/Plan:   Acute on chronic respiratory failure with hypoxia due to COVID-19 virus infection She is satting 98% on 2 L nasal cannula. Cont. steroids for 10 days and IV Remdesivir for 5 days. Try to keep the patient prone for at least 16 hours a day. The patient relates she is still short of breath with ambulation but that her breathing is better at rest.   DVT prophylaxis: lovenox Family Communication:none Disposition Plan/Barrier to D/C: once off oxygen Code Status:     Code Status Orders  (From admission, onward)         Start     Ordered   06/04/19 1932  Full code  Continuous     06/04/19 1938        Code Status History    This patient has a current code status but no historical code status.   Advance Care Planning Activity        IV Access:    Peripheral IV   Procedures and diagnostic studies:   Dg Chest Port 1 View  Result Date: 06/04/2019 CLINICAL DATA:  Shortness of breath. EXAM: PORTABLE CHEST 1 VIEW COMPARISON:  None. FINDINGS: The heart size and mediastinal contours are within normal limits. No pneumothorax or pleural effusion is noted. Multiple patchy airspace opacities are noted throughout both lungs. The visualized skeletal structures are unremarkable. IMPRESSION: Bilateral patchy airspace opacities are noted consistent with multifocal pneumonia. Electronically Signed   By: Lupita RaiderJames  Green Jr M.D.   On: 06/04/2019 14:20   [place] Ekg Site Rite   Result Date: 06/05/2019 If Site Rite image not attached, placement could not be confirmed due to current cardiac rhythm.  [place] Ekg Site Rite  Result Date: 06/05/2019 If Site Rite image not attached, placement could not be confirmed due to current cardiac rhythm.    Medical Consultants:    None.  Anti-Infectives:   None  Subjective:    Sherri Mason relates her breathing is better at rest but she gets short of breath with ambulation.  Objective:    Vitals:   06/05/19 2332 06/06/19 0522 06/06/19 0523 06/06/19 0525  BP: 120/61  (!) 111/57 119/64  Pulse: 64 (!) 50 (!) 49   Resp: 13 (!) 22 13   Temp: 97.9 F (36.6 C) 97.8 F (36.6 C)    TempSrc: Oral Oral    SpO2: 98% 99% 98%   Weight:      Height:       SpO2: 98 % O2 Flow Rate (L/min): 2 L/min   Intake/Output Summary (Last 24 hours) at 06/06/2019 0809 Last data filed at 06/06/2019 0700 Gross per 24 hour  Intake 970 ml  Output -  Net 970 ml   Filed Weights   06/05/19 0153  Weight: 49.9 kg    Exam: General exam: In no acute distress. Respiratory system: Good air movement and diffuse crackles at bases Cardiovascular system: S1 & S2 heard, RRR. No JVD. Gastrointestinal system: Abdomen  is nondistended, soft and nontender.  Central nervous system: Alert and oriented. No focal neurological deficits. Extremities: No pedal edema. Skin: No rashes, lesions or ulcers Psychiatry: Judgement and insight appear normal. Mood & affect appropriate.    Data Reviewed:    Labs: Basic Metabolic Panel: Recent Labs  Lab 06/04/19 1408 06/05/19 0311 06/06/19 0317  NA 142 139 140  K 3.8 3.9 4.0  CL 109 107 108  CO2 23 26 22   GLUCOSE 120* 178* 165*  BUN 8 13 17   CREATININE 0.46 0.47 0.48  CALCIUM 9.4 9.3 8.8*  MG  --  2.1  --   PHOS  --  3.7  --    GFR Estimated Creatinine Clearance: 53.7 mL/min (by C-G formula based on SCr of 0.48 mg/dL). Liver Function Tests: Recent Labs  Lab 06/04/19 1408  06/05/19 0311 06/06/19 0317  AST 34 36 29  ALT 22 26 27   ALKPHOS 64 60 63  BILITOT 0.6 0.3 0.3  PROT 7.4 6.9 6.3*  ALBUMIN 3.7 3.2* 3.1*   No results for input(s): LIPASE, AMYLASE in the last 168 hours. No results for input(s): AMMONIA in the last 168 hours. Coagulation profile No results for input(s): INR, PROTIME in the last 168 hours. COVID-19 Labs  Recent Labs    06/04/19 1408 06/04/19 2010 06/05/19 0311 06/06/19 0317  DDIMER  --  1.01* 0.63* <0.27  FERRITIN 790*  --  644* 644*  LDH 215*  --   --   --   CRP 7.4* 8.0* 7.6* 3.6*    Lab Results  Component Value Date   SARSCOV2NAA Detected (A) 05/25/2019    CBC: Recent Labs  Lab 06/04/19 1408 06/05/19 0311 06/06/19 0317  WBC 6.7 2.9* 15.2*  NEUTROABS 5.2 2.2 13.5*  HGB 12.6 12.0 10.9*  HCT 37.3 35.1* 32.8*  MCV 89.9 90.2 90.4  PLT 360 382 420*   Cardiac Enzymes: Recent Labs  Lab 06/05/19 0311  CKTOTAL 31*   BNP (last 3 results) No results for input(s): PROBNP in the last 8760 hours. CBG: Recent Labs  Lab 06/05/19 1440 06/05/19 1628 06/05/19 2115  GLUCAP 163* 168* 199*   D-Dimer: Recent Labs    06/05/19 0311 06/06/19 0317  DDIMER 0.63* <0.27   Hgb A1c: Recent Labs    06/05/19 0311  HGBA1C 5.5   Lipid Profile: Recent Labs    06/04/19 1408 06/05/19 0311  TRIG 218* 144   Thyroid function studies: No results for input(s): TSH, T4TOTAL, T3FREE, THYROIDAB in the last 72 hours.  Invalid input(s): FREET3 Anemia work up: Recent Labs    06/05/19 0311 06/06/19 0317  FERRITIN 644* 644*   Sepsis Labs: Recent Labs  Lab 06/04/19 1408 06/04/19 1559 06/04/19 2010 06/05/19 0311 06/06/19 0317  PROCALCITON <0.10  --  <0.10  --   --   WBC 6.7  --   --  2.9* 15.2*  LATICACIDVEN 1.0 1.0  --   --   --    Microbiology Recent Results (from the past 240 hour(s))  Blood Culture (routine x 2)     Status: None (Preliminary result)   Collection Time: 06/04/19  2:08 PM   Specimen: BLOOD   Result Value Ref Range Status   Specimen Description BLOOD R WRIST  Final   Special Requests   Final    BOTTLES DRAWN AEROBIC AND ANAEROBIC Blood Culture results may not be optimal due to an excessive volume of blood received in culture bottles   Culture   Final  NO GROWTH 2 DAYS Performed at [facility], [address] [place], [place] [address]    Report Status PENDING  Incomplete  Blood Culture (routine x 2)     Status: None (Preliminary result)   Collection Time: 06/04/19  2:08 PM   Specimen: BLOOD  Result Value Ref Range Status   Specimen Description BLOOD L AC  Final   Special Requests   Final    BOTTLES DRAWN AEROBIC AND ANAEROBIC Blood Culture adequate volume   Culture   Final    NO GROWTH 2 DAYS Performed at [facility], [address] [place], [place] [address]    Report Status PENDING  Incomplete     Medications:   . dexamethasone  6 mg Oral Daily  . docusate sodium  100 mg Oral BID  . enoxaparin (LOVENOX) injection  40 mg Subcutaneous Q24H  . guaiFENesin-dextromethorphan  10 mL Oral Q6H  . insulin aspart  0-5 Units Subcutaneous QHS  . insulin aspart  0-9 Units Subcutaneous TID WC  . insulin aspart  3 Units Subcutaneous TID WC  . insulin detemir  5 Units Subcutaneous Daily  . Ipratropium-Albuterol  1 puff Inhalation Q6H  . vitamin C  500 mg Oral Daily  . zinc sulfate  220 mg Oral Daily   Continuous Infusions: . remdesivir 100 mg in NS 250 mL 500 mL/hr at 06/06/19 0700     LOS: 2 days   Marinda ElkAbraham Feliz Ortiz  [facility]  06/06/2019, 8:09 AM

## 2019-06-07 LAB — GLUCOSE, CAPILLARY
Glucose-Capillary: 249 mg/dL — ABNORMAL HIGH (ref 70–99)
Glucose-Capillary: 89 mg/dL (ref 70–99)
Glucose-Capillary: 108 mg/dL — ABNORMAL HIGH (ref 70–99)
Glucose-Capillary: 151 mg/dL — ABNORMAL HIGH (ref 70–99)
Glucose-Capillary: 130 mg/dL — ABNORMAL HIGH (ref 70–99)

## 2019-06-07 LAB — COMPREHENSIVE METABOLIC PANEL
ALT: 25 U/L (ref 0–44)
AST: 22 U/L (ref 15–41)
Albumin: 3.4 g/dL — ABNORMAL LOW (ref 3.5–5.0)
Alkaline Phosphatase: 66 U/L (ref 38–126)
Anion gap: 9 (ref 5–15)
BUN: 17 mg/dL (ref 6–20)
CO2: 24 mmol/L (ref 22–32)
Calcium: 8.6 mg/dL — ABNORMAL LOW (ref 8.9–10.3)
Chloride: 107 mmol/L (ref 98–111)
Creatinine, Ser: 0.53 mg/dL (ref 0.44–1.00)
GFR calc Af Amer: >60 mL/min (ref >60)
GFR calc non Af Amer: >60 mL/min (ref >60)
Glucose, Bld: 95 mg/dL (ref 70–99)
Potassium: 3.8 mmol/L (ref 3.5–5.1)
Sodium: 140 mmol/L (ref 135–145)
Total Bilirubin: 0.5 mg/dL (ref 0.3–1.2)
Total Protein: 6.2 g/dL — ABNORMAL LOW (ref 6.5–8.1)

## 2019-06-07 LAB — CBC WITH DIFFERENTIAL/PLATELET
Abs Immature Granulocytes: 0.5 10*3/uL — ABNORMAL HIGH (ref 0.00–0.07)
Basophils Absolute: 0 10*3/uL (ref 0.0–0.1)
Basophils Relative: 0 %
Eosinophils Absolute: 0 10*3/uL (ref 0.0–0.5)
Eosinophils Relative: 0 %
HCT: 33.7 % — ABNORMAL LOW (ref 36.0–46.0)
Hemoglobin: 11.4 g/dL — ABNORMAL LOW (ref 12.0–15.0)
Immature Granulocytes: 4 %
Lymphocytes Relative: 11 %
Lymphs Abs: 1.4 10*3/uL (ref 0.7–4.0)
MCH: 30.5 pg (ref 26.0–34.0)
MCHC: 33.8 g/dL (ref 30.0–36.0)
MCV: 90.1 fL (ref 80.0–100.0)
Monocytes Absolute: 0.8 10*3/uL (ref 0.1–1.0)
Monocytes Relative: 6 %
Neutro Abs: 9.7 10*3/uL — ABNORMAL HIGH (ref 1.7–7.7)
Neutrophils Relative %: 79 %
Platelets: 458 10*3/uL — ABNORMAL HIGH (ref 150–400)
RBC: 3.74 MIL/uL — ABNORMAL LOW (ref 3.87–5.11)
RDW: 12.2 % (ref 11.5–15.5)
WBC: 12.4 10*3/uL — ABNORMAL HIGH (ref 4.0–10.5)
nRBC: 0 % (ref 0.0–0.2)

## 2019-06-07 LAB — FERRITIN: Ferritin: 635 ng/mL — ABNORMAL HIGH (ref 11–307)

## 2019-06-07 LAB — C-REACTIVE PROTEIN: CRP: 1.6 mg/dL — ABNORMAL HIGH (ref <1.0)

## 2019-06-07 LAB — HEPATITIS B SURFACE ANTIGEN: Hepatitis B Surface Ag: NEGATIVE

## 2019-06-07 LAB — D-DIMER, QUANTITATIVE: D-Dimer, Quant: 0.34 ug/mL-FEU (ref 0.00–0.50)

## 2019-06-07 MED ORDER — ALUM & MAG HYDROXIDE-SIMETH 200-200-20 MG/5ML PO SUSP
15.0000 mL | ORAL | Status: DC | PRN
  Administered 2019-06-07 – 2019-06-08 (×2): 15 mL via ORAL
  Filled 2019-06-07 (×2): qty 30

## 2019-06-07 NOTE — Progress Notes (Signed)
[facility] PROGRESS NOTE    Progress Note  Sherri Mason  EXB:[id number] DOB: 1956-01-23 DOA: 06/04/2019 PCP: Freddy Finner, NP     Brief Narrative:   Sherri Mason is an 63 y.o. female no past medical history presents with elements of acute respiratory distress due to COVID-19.  Patient is also positive on 05/29/2019 and has been progressively getting short of breath.  In the ED she was found to have be hypoxic reading 30 times per minute she was started on 2 L of oxygen.  Assessment/Plan:   Acute on chronic respiratory failure with hypoxia due to COVID-19 virus infection Continues to be on 2 L of oxygen to keep saturation above 94%. Transition IV steroids to oral dexamethasone continue IV Remdesivir for total 5 days Try to keep patient prone for at least 16 hours a day. Consult physical therapy. Inflammatory markers are slowly trending down.   DVT prophylaxis: lovenox Family Communication:none Disposition Plan/Barrier to D/C: once off oxygen Code Status:     Code Status Orders  (From admission, onward)         Start     Ordered   06/04/19 1932  Full code  Continuous     06/04/19 1938        Code Status History    This patient has a current code status but no historical code status.   Advance Care Planning Activity        IV Access:    Peripheral IV   Procedures and diagnostic studies:   [place] Ekg Site Rite  Result Date: 06/05/2019 If Site Rite image not attached, placement could not be confirmed due to current cardiac rhythm.  [place] Ekg Site Rite  Result Date: 06/05/2019 If Site Rite image not attached, placement could not be confirmed due to current cardiac rhythm.    Medical Consultants:    None.  Anti-Infectives:   None  Subjective:    Sherri Mason relates her breathing is unchanged compared to yesterday. Objective:    Vitals:   06/06/19 2344 06/06/19 2345 06/06/19 2348 06/07/19 0431  BP:  (!) 154/68 (!) 154/68 (!) 153/71 131/66  Pulse: (!) 52   60  Resp: 18   16  Temp: 97.6 F (36.4 C)   99.1 F (37.3 C)  TempSrc: Oral   Oral  SpO2: 98%   96%  Weight:      Height:       SpO2: 96 % O2 Flow Rate (L/min): 2 L/min   Intake/Output Summary (Last 24 hours) at 06/07/2019 0726 Last data filed at 06/07/2019 0700 Gross per 24 hour  Intake 980 ml  Output -  Net 980 ml   Filed Weights   06/05/19 0153  Weight: 49.9 kg    Exam: General exam: In no acute distress. Respiratory system: Good air movement and clear to auscultation. Cardiovascular system: S1 & S2 heard, RRR. No JVD. Gastrointestinal system: Abdomen is nondistended, soft and nontender.  Central nervous system: Alert and oriented. No focal neurological deficits. Extremities: No pedal edema. Skin: No rashes, lesions or ulcers Psychiatry: Judgement and insight appear normal. Mood & affect appropriate.    Data Reviewed:    Labs: Basic Metabolic Panel: Recent Labs  Lab 06/04/19 1408 06/05/19 0311 06/06/19 0317 06/07/19 0330  NA 142 139 140 140  K 3.8 3.9 4.0 3.8  CL 109 107 108 107  CO2 23 26 22 24   GLUCOSE 120* 178* 165* 95  BUN 8 13 17 17   CREATININE  0.46 0.47 0.48 0.53  CALCIUM 9.4 9.3 8.8* 8.6*  MG  --  2.1  --   --   PHOS  --  3.7  --   --    GFR Estimated Creatinine Clearance: 53.7 mL/min (by C-G formula based on SCr of 0.53 mg/dL). Liver Function Tests: Recent Labs  Lab 06/04/19 1408 06/05/19 0311 06/06/19 0317 06/07/19 0330  AST 34 36 29 22  ALT 22 26 27 25   ALKPHOS 64 60 63 66  BILITOT 0.6 0.3 0.3 0.5  PROT 7.4 6.9 6.3* 6.2*  ALBUMIN 3.7 3.2* 3.1* 3.4*   No results for input(s): LIPASE, AMYLASE in the last 168 hours. No results for input(s): AMMONIA in the last 168 hours. Coagulation profile No results for input(s): INR, PROTIME in the last 168 hours. COVID-19 Labs  Recent Labs    06/04/19 1408  06/04/19 2010 06/05/19 0311 06/06/19 0317 06/07/19 0330  DDIMER  --     < > 1.01* 0.63* <0.27 0.34  FERRITIN 790*  --   --  644* 644*  --   LDH 215*  --   --   --   --   --   CRP 7.4*  --  8.0* 7.6* 3.6*  --    < > = values in this interval not displayed.    Lab Results  Component Value Date   SARSCOV2NAA Detected (A) 05/25/2019    CBC: Recent Labs  Lab 06/04/19 1408 06/05/19 0311 06/06/19 0317 06/07/19 0330  WBC 6.7 2.9* 15.2* 12.4*  NEUTROABS 5.2 2.2 13.5* 9.7*  HGB 12.6 12.0 10.9* 11.4*  HCT 37.3 35.1* 32.8* 33.7*  MCV 89.9 90.2 90.4 90.1  PLT 360 382 420* 458*   Cardiac Enzymes: Recent Labs  Lab 06/05/19 0311  CKTOTAL 31*   BNP (last 3 results) No results for input(s): PROBNP in the last 8760 hours. CBG: Recent Labs  Lab 06/05/19 1628 06/05/19 2115 06/06/19 0813 06/06/19 1135 06/06/19 1548  GLUCAP 168* 199* 142* 123* 138*   D-Dimer: Recent Labs    06/06/19 0317 06/07/19 0330  DDIMER <0.27 0.34   Hgb A1c: Recent Labs    06/05/19 0311  HGBA1C 5.5   Lipid Profile: Recent Labs    06/04/19 1408 06/05/19 0311  TRIG 218* 144   Thyroid function studies: No results for input(s): TSH, T4TOTAL, T3FREE, THYROIDAB in the last 72 hours.  Invalid input(s): FREET3 Anemia work up: Recent Labs    06/05/19 0311 06/06/19 0317  FERRITIN 644* 644*   Sepsis Labs: Recent Labs  Lab 06/04/19 1408 06/04/19 1559 06/04/19 2010 06/05/19 0311 06/06/19 0317 06/07/19 0330  PROCALCITON <0.10  --  <0.10  --   --   --   WBC 6.7  --   --  2.9* 15.2* 12.4*  LATICACIDVEN 1.0 1.0  --   --   --   --    Microbiology Recent Results (from the past 240 hour(s))  Blood Culture (routine x 2)     Status: None (Preliminary result)   Collection Time: 06/04/19  2:08 PM   Specimen: BLOOD  Result Value Ref Range Status   Specimen Description BLOOD R WRIST  Final   Special Requests   Final    BOTTLES DRAWN AEROBIC AND ANAEROBIC Blood Culture results may not be optimal due to an excessive volume of blood received in culture bottles   Culture    Final    NO GROWTH 2 DAYS Performed at [facility], [address]  [place], [place] [address]    Report Status PENDING  Incomplete  Blood Culture (routine x 2)     Status: None (Preliminary result)   Collection Time: 06/04/19  2:08 PM   Specimen: BLOOD  Result Value Ref Range Status   Specimen Description BLOOD L AC  Final   Special Requests   Final    BOTTLES DRAWN AEROBIC AND ANAEROBIC Blood Culture adequate volume   Culture   Final    NO GROWTH 2 DAYS Performed at [facility], [address] [place], [place] [address]    Report Status PENDING  Incomplete     Medications:   . dexamethasone  6 mg Oral Daily  . docusate sodium  100 mg Oral BID  . enoxaparin (LOVENOX) injection  40 mg Subcutaneous Q24H  . guaiFENesin-dextromethorphan  10 mL Oral Q6H  . insulin aspart  0-5 Units Subcutaneous QHS  . insulin aspart  0-9 Units Subcutaneous TID WC  . insulin aspart  3 Units Subcutaneous TID WC  . insulin detemir  5 Units Subcutaneous Daily  . Ipratropium-Albuterol  1 puff Inhalation Q6H  . vitamin C  500 mg Oral Daily  . zinc sulfate  220 mg Oral Daily   Continuous Infusions: . remdesivir 100 mg in NS 250 mL Stopped (06/07/19 0700)     LOS: 3 days   Marinda ElkAbraham Feliz Ortiz  [facility]  06/07/2019, 7:26 AM

## 2019-06-07 NOTE — Evaluation (Addendum)
Physical Therapy Evaluation Patient Details Name: Sherri Mason MRN: [id number] DOB: November 01, 1956 Today's Date: 06/07/2019   History of Present Illness  Sherri Mason is an 63 y.o. female no past medical history presents with elements of acute respiratory distress due to COVID-19.  Patient is also positive on 05/29/2019 and has been progressively getting short of breath.  In the ED she was found to  be hypoxic  Clinical Impression  The  Patient ambulated x 100' on RA with SaO2 >91%. Dyspnea 2/4. The patient lives with spouse who works and  and 41 Y/O daughter. Pt admitted with above diagnosis. Pt currently with functional limitations due to the deficits listed below (see PT Problem List).  Pt will benefit from skilled PT to increase their independence and safety with mobility to allow discharge to the venue listed below.       Follow Up Recommendations  none    Equipment Recommendations  None recommended by PT    Recommendations for Other Services       Precautions / Restrictions Precautions Precaution Comments: monitor sats on RA      Mobility  Bed Mobility Overal bed mobility: Needs Assistance Bed Mobility: Supine to Sit     Supine to sit: Supervision     General bed mobility comments: extra time  Transfers Overall transfer level: Needs assistance Equipment used: None Transfers: Sit to/from Stand Sit to Stand: Min guard         General transfer comment: close guarding, no asist  Ambulation/Gait Ambulation/Gait assistance: Min guard;Min assist Gait Distance (Feet): 100 Feet Assistive device: 1 person hand held assist Gait Pattern/deviations: Step-through pattern     General Gait Details: gait is slow and steady  Science writer    Modified Rankin (Stroke Patients Only)       Balance Overall balance assessment: Needs assistance   Sitting balance-Leahy Scale: Good     Standing balance support:  During functional activity;No upper extremity supported Standing balance-Leahy Scale: Fair                               Pertinent Vitals/Pain Pain Assessment: No/denies pain    Home Living Family/patient expects to be discharged to:: Private residence Living Arrangements: Spouse/significant other;Children Available Help at Discharge: Family;Available 24 hours/day Type of Home: Mobile home Home Access: Stairs to enter Entrance Stairs-Rails: Psychiatric nurse of Steps: 4 Home Layout: One level Home Equipment: None   Spouse works, 42 yo daughter at home   Prior Function Level of Independence: Independent               Journalist, newspaper        Extremity/Trunk Assessment   Upper Extremity Assessment Upper Extremity Assessment: Generalized weakness    Lower Extremity Assessment Lower Extremity Assessment: Generalized weakness    Cervical / Trunk Assessment Cervical / Trunk Assessment: Normal  Communication   Communication: Prefers language other than English;Interpreter utilized(claudia)  Cognition Arousal/Alertness: Awake/alert Behavior During Therapy: WFL for tasks assessed/performed;Flat affect Overall Cognitive Status: Within Functional Limits for tasks assessed                                        General Comments      Exercises     Assessment/Plan  PT Assessment Patient needs continued PT services  PT Problem List Decreased strength;Decreased activity tolerance;Decreased knowledge of use of DME;Decreased mobility       PT Treatment Interventions Gait training;Stair training;Functional mobility training;Therapeutic exercise;Therapeutic activities;Patient/family education    PT Goals (Current goals can be found in the Care Plan section)  Acute Rehab PT Goals Patient Stated Goal: to go home PT Goal Formulation: With patient Time For Goal Achievement: 06/21/19 Potential to Achieve Goals: Good     Frequency Min 3X/week   Barriers to discharge        Co-evaluation               AM-PAC PT "6 Clicks" Mobility  Outcome Measure Help needed turning from your back to your side while in a flat bed without using bedrails?: None Help needed moving from lying on your back to sitting on the side of a flat bed without using bedrails?: None Help needed moving to and from a bed to a chair (including a wheelchair)?: A Little Help needed standing up from a chair using your arms (e.g., wheelchair or bedside chair)?: A Little Help needed to walk in hospital room?: A Little Help needed climbing 3-5 steps with a railing? : A Lot 6 Click Score: 19    End of Session   Activity Tolerance: Patient tolerated treatment well Patient left: in chair;with call bell/phone within reach Nurse Communication: Mobility status PT Visit Diagnosis: Difficulty in walking, not elsewhere classified (R26.2)    Time: [id number] PT Time Calculation (min) (ACUTE ONLY): 23 min   Charges:   PT Evaluation $PT Eval Low Complexity: 1 Low PT Treatments $Gait Training: 8-22 mins        Blanchard KelchKaren Morine Kohlman PT Acute Rehabilitation Services  Office [phone]   Rada HayHill, Mykela Mewborn Elizabeth 06/07/2019, 2:18 PM

## 2019-06-07 NOTE — Plan of Care (Signed)

## 2019-06-08 LAB — GLUCOSE, CAPILLARY
Glucose-Capillary: 81 mg/dL (ref 70–99)
Glucose-Capillary: 85 mg/dL (ref 70–99)
Glucose-Capillary: 131 mg/dL — ABNORMAL HIGH (ref 70–99)
Glucose-Capillary: 154 mg/dL — ABNORMAL HIGH (ref 70–99)
Glucose-Capillary: 169 mg/dL — ABNORMAL HIGH (ref 70–99)

## 2019-06-08 LAB — COMPREHENSIVE METABOLIC PANEL
ALT: 39 U/L (ref 0–44)
AST: 43 U/L — ABNORMAL HIGH (ref 15–41)
Albumin: 3.2 g/dL — ABNORMAL LOW (ref 3.5–5.0)
Alkaline Phosphatase: 63 U/L (ref 38–126)
Anion gap: 11 (ref 5–15)
BUN: 17 mg/dL (ref 6–20)
CO2: 22 mmol/L (ref 22–32)
Calcium: 8.7 mg/dL — ABNORMAL LOW (ref 8.9–10.3)
Chloride: 106 mmol/L (ref 98–111)
Creatinine, Ser: 0.45 mg/dL (ref 0.44–1.00)
GFR calc Af Amer: >60 mL/min (ref >60)
GFR calc non Af Amer: >60 mL/min (ref >60)
Glucose, Bld: 84 mg/dL (ref 70–99)
Potassium: 3.9 mmol/L (ref 3.5–5.1)
Sodium: 139 mmol/L (ref 135–145)
Total Bilirubin: 0.3 mg/dL (ref 0.3–1.2)
Total Protein: 6.1 g/dL — ABNORMAL LOW (ref 6.5–8.1)

## 2019-06-08 LAB — CBC WITH DIFFERENTIAL/PLATELET
Abs Immature Granulocytes: 0.36 10*3/uL — ABNORMAL HIGH (ref 0.00–0.07)
Basophils Absolute: 0 10*3/uL (ref 0.0–0.1)
Basophils Relative: 0 %
Eosinophils Absolute: 0 10*3/uL (ref 0.0–0.5)
Eosinophils Relative: 0 %
HCT: 35.4 % — ABNORMAL LOW (ref 36.0–46.0)
Hemoglobin: 11.7 g/dL — ABNORMAL LOW (ref 12.0–15.0)
Immature Granulocytes: 4 %
Lymphocytes Relative: 21 %
Lymphs Abs: 1.9 10*3/uL (ref 0.7–4.0)
MCH: 29.8 pg (ref 26.0–34.0)
MCHC: 33.1 g/dL (ref 30.0–36.0)
MCV: 90.1 fL (ref 80.0–100.0)
Monocytes Absolute: 0.8 10*3/uL (ref 0.1–1.0)
Monocytes Relative: 9 %
Neutro Abs: 6 10*3/uL (ref 1.7–7.7)
Neutrophils Relative %: 66 %
Platelets: 477 10*3/uL — ABNORMAL HIGH (ref 150–400)
RBC: 3.93 MIL/uL (ref 3.87–5.11)
RDW: 12 % (ref 11.5–15.5)
WBC: 9.1 10*3/uL (ref 4.0–10.5)
nRBC: 0 % (ref 0.0–0.2)

## 2019-06-08 LAB — C-REACTIVE PROTEIN: CRP: 1 mg/dL — ABNORMAL HIGH (ref <1.0)

## 2019-06-08 LAB — D-DIMER, QUANTITATIVE: D-Dimer, Quant: 0.44 ug/mL-FEU (ref 0.00–0.50)

## 2019-06-08 LAB — FERRITIN: Ferritin: 630 ng/mL — ABNORMAL HIGH (ref 11–307)

## 2019-06-08 MED ORDER — GUAIFENESIN-DM 100-10 MG/5ML PO SYRP
10.0000 mL | ORAL_SOLUTION | Freq: Four times a day (QID) | ORAL | Status: DC
  Administered 2019-06-08 – 2019-06-09 (×4): 10 mL via ORAL
  Filled 2019-06-08 (×3): qty 10

## 2019-06-08 NOTE — Evaluation (Signed)
Occupational Therapy Evaluation Patient Details Name: Sherri Mason MRN: [id number] DOB: 07/04/1956 Today's Date: 06/08/2019    History of Present Illness Sherri Mason is an 63 y.o. female no past medical history presents with elements of acute respiratory distress due to COVID-19.  Patient is also positive on 05/29/2019 and has been progressively getting short of breath.  In the ED she was found to have be hypoxic   Clinical Impression   PTA, pt was living with her husband and daughter and was independent and working. Pt currently performing ADLs at Supervision level. Pt performing sponge bath at sink with Supervision demonstrating good standing tolerance. VSS throughout on RA. Pt reporting fatigue and requesting to return to bed at end of session. Recommend dc to home once medically stable per physician. Will continue to follow acutely to provide education on EC and facilitate safe dc.     Follow Up Recommendations  No OT follow up    Equipment Recommendations  None recommended by OT    Recommendations for Other Services       Precautions / Restrictions Precautions Precautions: Other (comment) Precaution Comments: Monitor vitals      Mobility Bed Mobility Overal bed mobility: Modified Independent Bed Mobility: Supine to Sit;Sit to Supine     Supine to sit: Supervision     General bed mobility comments: Increased time  Transfers Overall transfer level: Needs assistance Equipment used: None Transfers: Sit to/from Stand Sit to Stand: Supervision         General transfer comment: supervisio nfor safety    Balance Overall balance assessment: Needs assistance   Sitting balance-Leahy Scale: Good     Standing balance support: During functional activity;No upper extremity supported Standing balance-Leahy Scale: Good Standing balance comment: Able to perform sponge bath at sink without LOB                           ADL either  performed or assessed with clinical judgement   ADL Overall ADL's : Needs assistance/impaired Eating/Feeding: Independent;Sitting   Grooming: Wash/dry face;Wash/dry hands;Supervision/safety;Standing   Upper Body Bathing: Supervision/ safety;Standing Upper Body Bathing Details (indicate cue type and reason): Pt performing sponge bath at sink with supervision for safety Lower Body Bathing: Supervison/ safety;Sit to/from stand   Upper Body Dressing : Supervision/safety;Sitting   Lower Body Dressing: Supervision/safety;Sit to/from stand Lower Body Dressing Details (indicate cue type and reason): donned new socks Toilet Transfer: Supervision/safety;Ambulation;Regular Toilet   Toileting- Clothing Manipulation and Hygiene: Supervision/safety;Sitting/lateral lean       Functional mobility during ADLs: Supervision/safety General ADL Comments: Pt demonstrating good balance and standing tolerance. Pt Spo2 maintaining >92% on RA throughout. Pt reporting fatigue and requesting to return to bed after sponge bath in sink     Vision         Perception     Praxis      Pertinent Vitals/Pain Pain Assessment: No/denies pain     Hand Dominance Right   Extremity/Trunk Assessment Upper Extremity Assessment Upper Extremity Assessment: Generalized weakness   Lower Extremity Assessment Lower Extremity Assessment: Generalized weakness   Cervical / Trunk Assessment Cervical / Trunk Assessment: Normal   Communication Communication Communication: Prefers language other than English([employer] [place] ID#[id number])   Cognition Arousal/Alertness: Awake/alert Behavior During Therapy: WFL for tasks assessed/performed Overall Cognitive Status: Within Functional Limits for tasks assessed  General Comments  VSS throughout on RA    Exercises     Shoulder Instructions      Home Living Family/patient expects to be discharged to::  Private residence Living Arrangements: Spouse/significant other;Children Available Help at Discharge: Family;Available 24 hours/day Type of Home: Mobile home Home Access: Stairs to enter Entrance Stairs-Number of Steps: 4 Entrance Stairs-Rails: Right;Left Home Layout: One level     Bathroom Shower/Tub: Teacher, early years/pre: Standard     Home Equipment: None          Prior Functioning/Environment Level of Independence: Independent                 OT Problem List: Decreased range of motion;Decreased activity tolerance;Decreased knowledge of use of DME or AE;Decreased knowledge of precautions;Cardiopulmonary status limiting activity      OT Treatment/Interventions: Self-care/ADL training;Therapeutic exercise;Energy conservation;DME and/or AE instruction;Therapeutic activities;Patient/family education    OT Goals(Current goals can be found in the care plan section) Acute Rehab OT Goals Patient Stated Goal: "Return home to my family" OT Goal Formulation: With patient Time For Goal Achievement: 06/22/19 Potential to Achieve Goals: Good  OT Frequency: Min 2X/week   Barriers to D/C:            Co-evaluation              AM-PAC OT "6 Clicks" Daily Activity     Outcome Measure Help from another person eating meals?: None Help from another person taking care of personal grooming?: None Help from another person toileting, which includes using toliet, bedpan, or urinal?: A Little Help from another person bathing (including washing, rinsing, drying)?: A Little Help from another person to put on and taking off regular upper body clothing?: None Help from another person to put on and taking off regular lower body clothing?: A Little 6 Click Score: 21   End of Session Nurse Communication: Mobility status(Would appreciate a shower tomorrow)  Activity Tolerance: Patient tolerated treatment well Patient left: in bed;with call bell/phone within reach  OT  Visit Diagnosis: Unsteadiness on feet (R26.81);Other abnormalities of gait and mobility (R26.89);Muscle weakness (generalized) (M62.81)                Time: 5009-3818 OT Time Calculation (min): 26 min Charges:  OT General Charges $OT Visit: 1 Visit OT Evaluation $OT Eval Low Complexity: 1 Low OT Treatments $Self Care/Home Management : 8-22 mins  Malikah Lakey MSOT, OTR/L Acute Rehab Pager: [phone] Office: [place] 06/08/2019, 1:22 PM

## 2019-06-08 NOTE — Progress Notes (Addendum)
[facility] PROGRESS NOTE    Progress Note  Sherri Mason  [id number] DOB: 06/29/1956 DOA: 06/04/2019 PCP: Sandrea Hughsubio, Jessica, NP     Brief Narrative:   Sherri Mason is an 63 y.o. female no past medical history presents with elements of acute respiratory distress due to COVID-19.  Patient is also positive on 05/29/2019 and has been progressively getting short of breath.  In the ED she was found to have be hypoxic reading 30 times per minute she was started on 2 L of oxygen.  Assessment/Plan:   Acute on chronic respiratory failure with hypoxia due to COVID-19 virus infection Continues to require 2 L of oxygen to keep saturation above 94%. Currently on oral dexamethasone, continue IV Remdesivir for a total of 5 days. Try to keep the patient prone for at least 16 hours a day. Physical therapy evaluated the patient recommended no home health PT. Inflammatory markers continue to improve.  DVT prophylaxis: lovenox Family Communication:none Disposition Plan/Barrier to D/C: once off oxygen Code Status:     Code Status Orders  (From admission, onward)         Start     Ordered   06/04/19 1932  Full code  Continuous     06/04/19 1938        Code Status History    This patient has a current code status but no historical code status.   Advance Care Planning Activity        IV Access:    Peripheral IV   Procedures and diagnostic studies:   No results found.   Medical Consultants:    None.  Anti-Infectives:   None  Subjective:    Sherri Mason relates her breathing is unchanged compared to yesterday. Objective:    Vitals:   06/07/19 1145 06/07/19 1645 06/07/19 2116 06/08/19 0404  BP: (!) 152/67 126/63 (!) 143/62 (!) 144/68  Pulse: (!) 59  (!) 58 64  Resp: 17  18 16   Temp: 98.7 F (37.1 C) 98.2 F (36.8 C) 98.4 F (36.9 C) 97.9 F (36.6 C)  TempSrc: Oral  Oral Oral  SpO2: 95%  95% 98%  Weight:       Height:       SpO2: 98 % O2 Flow Rate (L/min): 2 L/min   Intake/Output Summary (Last 24 hours) at 06/08/2019 0713 Last data filed at 06/07/2019 2300 Gross per 24 hour  Intake 250 ml  Output -  Net 250 ml   Filed Weights   06/05/19 0153  Weight: 49.9 kg    Exam: General exam: In no acute distress. Respiratory system: Good air movement and clear to auscultation. Cardiovascular system: S1 & S2 heard, RRR. No JVD. Gastrointestinal system: Abdomen is nondistended, soft and nontender.  Central nervous system: Alert and oriented. No focal neurological deficits. Extremities: No pedal edema. Skin: No rashes, lesions or ulcers Psychiatry: Judgement and insight appear normal. Mood & affect appropriate.    Data Reviewed:    Labs: Basic Metabolic Panel: Recent Labs  Lab 06/04/19 1408 06/05/19 0311 06/06/19 0317 06/07/19 0330 06/08/19 0225  NA 142 139 140 140 139  K 3.8 3.9 4.0 3.8 3.9  CL 109 107 108 107 106  CO2 23 26 22 24 22   GLUCOSE 120* 178* 165* 95 84  BUN 8 13 17 17 17   CREATININE 0.46 0.47 0.48 0.53 0.45  CALCIUM 9.4 9.3 8.8* 8.6* 8.7*  MG  --  2.1  --   --   --  PHOS  --  3.7  --   --   --    GFR Estimated Creatinine Clearance: 53.7 mL/min (by C-G formula based on SCr of 0.45 mg/dL). Liver Function Tests: Recent Labs  Lab 06/04/19 1408 06/05/19 0311 06/06/19 0317 06/07/19 0330 06/08/19 0225  AST 34 36 29 22 43*  ALT 22 26 27 25  39  ALKPHOS 64 60 63 66 63  BILITOT 0.6 0.3 0.3 0.5 0.3  PROT 7.4 6.9 6.3* 6.2* 6.1*  ALBUMIN 3.7 3.2* 3.1* 3.4* 3.2*   No results for input(s): LIPASE, AMYLASE in the last 168 hours. No results for input(s): AMMONIA in the last 168 hours. Coagulation profile No results for input(s): INR, PROTIME in the last 168 hours. COVID-19 Labs  Recent Labs    06/06/19 0317 06/07/19 0330 06/08/19 0225  DDIMER <0.27 0.34 0.44  FERRITIN 644* 635* 630*  CRP 3.6* 1.6* 1.0*    Lab Results  Component Value Date   SARSCOV2NAA  Detected (A) 05/25/2019    CBC: Recent Labs  Lab 06/04/19 1408 06/05/19 0311 06/06/19 0317 06/07/19 0330 06/08/19 0225  WBC 6.7 2.9* 15.2* 12.4* 9.1  NEUTROABS 5.2 2.2 13.5* 9.7* 6.0  HGB 12.6 12.0 10.9* 11.4* 11.7*  HCT 37.3 35.1* 32.8* 33.7* 35.4*  MCV 89.9 90.2 90.4 90.1 90.1  PLT 360 382 420* 458* 477*   Cardiac Enzymes: Recent Labs  Lab 06/05/19 0311  CKTOTAL 31*   BNP (last 3 results) No results for input(s): PROBNP in the last 8760 hours. CBG: Recent Labs  Lab 06/06/19 2052 06/07/19 0825 06/07/19 1143 06/07/19 1647 06/07/19 2118  GLUCAP 249* 89 108* 151* 130*   D-Dimer: Recent Labs    06/07/19 0330 06/08/19 0225  DDIMER 0.34 0.44   Hgb A1c: No results for input(s): HGBA1C in the last 72 hours. Lipid Profile: No results for input(s): CHOL, HDL, LDLCALC, TRIG, CHOLHDL, LDLDIRECT in the last 72 hours. Thyroid function studies: No results for input(s): TSH, T4TOTAL, T3FREE, THYROIDAB in the last 72 hours.  Invalid input(s): FREET3 Anemia work up: Recent Labs    06/07/19 0330 06/08/19 0225  FERRITIN 635* 630*   Sepsis Labs: Recent Labs  Lab 06/04/19 1408 06/04/19 1559 06/04/19 2010 06/05/19 0311 06/06/19 0317 06/07/19 0330 06/08/19 0225  PROCALCITON <0.10  --  <0.10  --   --   --   --   WBC 6.7  --   --  2.9* 15.2* 12.4* 9.1  LATICACIDVEN 1.0 1.0  --   --   --   --   --    Microbiology Recent Results (from the past 240 hour(s))  Blood Culture (routine x 2)     Status: None (Preliminary result)   Collection Time: 06/04/19  2:08 PM   Specimen: BLOOD  Result Value Ref Range Status   Specimen Description BLOOD R WRIST  Final   Special Requests   Final    BOTTLES DRAWN AEROBIC AND ANAEROBIC Blood Culture results may not be optimal due to an excessive volume of blood received in culture bottles   Culture   Final    NO GROWTH 3 DAYS Performed at [facility], [address] [place], [place] [address]    Report Status PENDING   Incomplete  Blood Culture (routine x 2)     Status: None (Preliminary result)   Collection Time: 06/04/19  2:08 PM   Specimen: BLOOD  Result Value Ref Range Status   Specimen Description BLOOD L AC  Final   Special  Requests   Final    BOTTLES DRAWN AEROBIC AND ANAEROBIC Blood Culture adequate volume   Culture   Final    NO GROWTH 3 DAYS Performed at [facility], [address] [place], [place] [address]    Report Status PENDING  Incomplete     Medications:   . dexamethasone  6 mg Oral Daily  . docusate sodium  100 mg Oral BID  . enoxaparin (LOVENOX) injection  40 mg Subcutaneous Q24H  . guaiFENesin-dextromethorphan  10 mL Oral Q6H  . insulin aspart  0-5 Units Subcutaneous QHS  . insulin aspart  0-9 Units Subcutaneous TID WC  . insulin aspart  3 Units Subcutaneous TID WC  . insulin detemir  5 Units Subcutaneous Daily  . Ipratropium-Albuterol  1 puff Inhalation Q6H  . vitamin C  500 mg Oral Daily  . zinc sulfate  220 mg Oral Daily   Continuous Infusions: . remdesivir 100 mg in NS 250 mL Stopped (06/07/19 2241)     LOS: 4 days   Marinda ElkAbraham Feliz Ortiz  [facility]  06/08/2019, 7:13 AM

## 2019-06-08 NOTE — Progress Notes (Signed)
Physical Therapy Treatment Patient Details Name: Sherri Mason MRN: [id number] DOB: 1956/03/26 Today's Date: 06/08/2019    History of Present Illness (P) Sherri Mason is an 63 y.o. female no past medical history presents with elements of acute respiratory distress due to COVID-19.  Patient is also positive on 05/29/2019 and has been progressively getting short of breath.  In the ED she was found to have be hypoxic    PT Comments    The patient increased distance ambulating x  200'.  On RA with SaO2 94%. Patient requires slight HHA near end of walk with fatiguing. Continue PT.  Follow Up Recommendations  No PT follow up     Equipment Recommendations  None recommended by PT    Recommendations for Other Services       Precautions / Restrictions Precautions Precautions: (P) Other (comment) Precaution Comments: (P) Monitor vitals    Mobility  Bed Mobility   Bed Mobility: Supine to Sit     Supine to sit: Supervision     General bed mobility comments: extra time  Transfers Overall transfer level: Needs assistance Equipment used: None Transfers: Sit to/from Stand Sit to Stand: Min guard         General transfer comment: close guarding, no asist  Ambulation/Gait Ambulation/Gait assistance: Min guard;Min assist Gait Distance (Feet): 200 Feet Assistive device: 1 person hand held assist Gait Pattern/deviations: Step-through pattern;Drifts right/left     General Gait Details: with increased distance, required HHA last 100'  due to unsteady gait   Stairs             Wheelchair Mobility    Modified Rankin (Stroke Patients Only)       Balance Overall balance assessment: Needs assistance   Sitting balance-Leahy Scale: Good     Standing balance support: During functional activity;No upper extremity supported Standing balance-Leahy Scale: Fair                              Cognition Arousal/Alertness:  Awake/alert Behavior During Therapy: WFL for tasks assessed/performed;Flat affect                                          Exercises      General Comments        Pertinent Vitals/Pain Pain Assessment: No/denies pain    Home Living Family/patient expects to be discharged to:: (P) Private residence                    Prior Function            PT Goals (current goals can now be found in the care plan section) Progress towards PT goals: Progressing toward goals    Frequency    Min 3X/week      PT Plan Current plan remains appropriate    Co-evaluation              AM-PAC PT "6 Clicks" Mobility   Outcome Measure  Help needed turning from your back to your side while in a flat bed without using bedrails?: None Help needed moving from lying on your back to sitting on the side of a flat bed without using bedrails?: None Help needed moving to and from a bed to a chair (including a wheelchair)?: A Little Help needed standing up from a chair using  your arms (e.g., wheelchair or bedside chair)?: A Little Help needed to walk in hospital room?: A Little Help needed climbing 3-5 steps with a railing? : A Little 6 Click Score: 20    End of Session   Activity Tolerance: Patient tolerated treatment well Patient left: in chair;with call bell/phone within reach Nurse Communication: Mobility status PT Visit Diagnosis: Difficulty in walking, not elsewhere classified (R26.2)     Time: [id number] PT Time Calculation (min) (ACUTE ONLY): 14 min  Charges:  $Gait Training: 8-22 mins                       Blanchard KelchKaren Elizah Lydon PT Acute Rehabilitation Services  Office [phone]    Rada HayHill, Amani Marseille Elizabeth 06/08/2019, 1:14 PM

## 2019-06-09 LAB — GLUCOSE, CAPILLARY
Glucose-Capillary: 88 mg/dL (ref 70–99)
Glucose-Capillary: 141 mg/dL — ABNORMAL HIGH (ref 70–99)

## 2019-06-09 LAB — COMPREHENSIVE METABOLIC PANEL
ALT: 36 U/L (ref 0–44)
AST: 25 U/L (ref 15–41)
Albumin: 3.2 g/dL — ABNORMAL LOW (ref 3.5–5.0)
Alkaline Phosphatase: 64 U/L (ref 38–126)
Anion gap: 8 (ref 5–15)
BUN: 17 mg/dL (ref 6–20)
CO2: 24 mmol/L (ref 22–32)
Calcium: 8.7 mg/dL — ABNORMAL LOW (ref 8.9–10.3)
Chloride: 106 mmol/L (ref 98–111)
Creatinine, Ser: 0.47 mg/dL (ref 0.44–1.00)
GFR calc Af Amer: >60 mL/min (ref >60)
GFR calc non Af Amer: >60 mL/min (ref >60)
Glucose, Bld: 79 mg/dL (ref 70–99)
Potassium: 4 mmol/L (ref 3.5–5.1)
Sodium: 138 mmol/L (ref 135–145)
Total Bilirubin: 0.3 mg/dL (ref 0.3–1.2)
Total Protein: 5.9 g/dL — ABNORMAL LOW (ref 6.5–8.1)

## 2019-06-09 LAB — CBC WITH DIFFERENTIAL/PLATELET
Abs Immature Granulocytes: 0.27 10*3/uL — ABNORMAL HIGH (ref 0.00–0.07)
Basophils Absolute: 0 10*3/uL (ref 0.0–0.1)
Basophils Relative: 0 %
Eosinophils Absolute: 0 10*3/uL (ref 0.0–0.5)
Eosinophils Relative: 0 %
HCT: 34.9 % — ABNORMAL LOW (ref 36.0–46.0)
Hemoglobin: 11.6 g/dL — ABNORMAL LOW (ref 12.0–15.0)
Immature Granulocytes: 3 %
Lymphocytes Relative: 19 %
Lymphs Abs: 1.7 10*3/uL (ref 0.7–4.0)
MCH: 29.9 pg (ref 26.0–34.0)
MCHC: 33.2 g/dL (ref 30.0–36.0)
MCV: 89.9 fL (ref 80.0–100.0)
Monocytes Absolute: 0.7 10*3/uL (ref 0.1–1.0)
Monocytes Relative: 8 %
Neutro Abs: 6.3 10*3/uL (ref 1.7–7.7)
Neutrophils Relative %: 70 %
Platelets: 443 10*3/uL — ABNORMAL HIGH (ref 150–400)
RBC: 3.88 MIL/uL (ref 3.87–5.11)
RDW: 12.2 % (ref 11.5–15.5)
WBC: 9 10*3/uL (ref 4.0–10.5)
nRBC: 0 % (ref 0.0–0.2)

## 2019-06-09 LAB — D-DIMER, QUANTITATIVE: D-Dimer, Quant: 0.35 ug/mL-FEU (ref 0.00–0.50)

## 2019-06-09 LAB — FERRITIN: Ferritin: 616 ng/mL — ABNORMAL HIGH (ref 11–307)

## 2019-06-09 LAB — CULTURE, BLOOD (ROUTINE X 2)
Culture: NO GROWTH
Culture: NO GROWTH
Special Requests: ADEQUATE

## 2019-06-09 LAB — C-REACTIVE PROTEIN: CRP: 0.8 mg/dL (ref <1.0)

## 2019-06-09 MED ORDER — MULTI-VITAMIN/MINERALS PO TABS: 1.0000 | ORAL_TABLET | Freq: Every day | ORAL | 0 refills | Status: AC

## 2019-06-09 MED ORDER — DEXAMETHASONE 6 MG PO TABS: 6.0000 mg | ORAL_TABLET | Freq: Every day | ORAL | 0 refills | Status: AC

## 2019-06-09 NOTE — Discharge Summary (Signed)
Physician Discharge Summary  Sherri Mason OZH:[id number] DOB: 10/16/1956 DOA: 06/04/2019  PCP: Sandrea Hughsubio, Jessica, NP  Admit date: 06/04/2019 Discharge date: 06/09/2019  Admitted From: home Disposition:  home  Recommendations for Outpatient Follow-up:  1. Follow up with PCP in 1-2 weeks  Home Health: none Equipment/Devices: none  Discharge Condition: stable CODE STATUS: Full code Diet recommendation: regular  HPI: Per admitting MD, Sherri Mason is a 63 y.o. female with no significant past medical history Presented in [place] in acute respiratory distress hypoxic due to COVID 19 infection. Patient had a positive COVID-19 infection, has [facility] for respiratory distress per records did not meet the criteria for admission.  Patient's initial positive COVID-19 test was on the July 11th, 2020, She has been progressively getting worse with shortness of breath.  Today at [facility] patient was seen again was satting 86% on room air and worsening with exertion tachypneic with aspiratory rate 30s-40s.  Improved to 92% on 2 L by nasal cannula. Patient reporting of subjective fever, chills, dry cough, shortness of breath.   Patient Denies having: Chest Pain, Abd pain, N/V/D, headache, dizziness, lightheadedness, joint pain, rash, open wounds ED Course:   Patient was seen at [facility] ED, was satting 86% on room air, improved to low 90s, 92% with 2 L of oxygen, apparently was de-satting with further exertion. CBC CMP were obtained within normal limits LDH 215, triglyceride 218, ferritin 790, lactic acid 1.0, procalcitonin level <0.10 x2, BBC 6.7, fibrinogen 651, d-dimer 953.04 Blood cultures were obtained at [facility]   Hospital Course: Acute hypoxic respiratory failure due to COVID-19 viral pneumonia -patient was admitted to the hospital with hypoxia in the setting of COVID-19 infection, she was started on dexamethasone along with  Remdesivir, completed 5 days.  She improved with above-mentioned treatment, she was able to be weaned off to room air, now ambulating in the room in the hallway without difficulties, physical therapy evaluated patient without further needs.  Her inflammatory markers are improving, she is back to normal and will be discharged home in stable condition.  COVID-19 Labs  Recent Labs    06/07/19 0330 06/08/19 0225 06/09/19 0350  DDIMER 0.34 0.44 0.35  FERRITIN 635* 630* 616*  CRP 1.6* 1.0* 0.8    Lab Results  Component Value Date   SARSCOV2NAA Detected (A) 05/25/2019     Discharge Diagnoses:  Principal Problem:   Acute on chronic respiratory failure with hypoxia (HCC) Active Problems:   COVID-19 virus infection     Discharge Instructions   Allergies as of 06/09/2019      Reactions   Aspirin Other (See Comments)   Abdominal pain      Medication List    STOP taking these medications   levofloxacin 750 MG tablet Commonly known as: LEVAQUIN     TAKE these medications   acetaminophen 325 MG tablet Commonly known as: TYLENOL Take 650 mg by mouth every 6 (six) hours as needed for fever.   albuterol 108 (90 Base) MCG/ACT inhaler Commonly known as: VENTOLIN HFA Inhale 1-2 puffs into the lungs every 4 (four) hours as needed for wheezing or shortness of breath.   benzonatate 100 MG capsule Commonly known as: TESSALON Take 100 mg by mouth 3 (three) times daily.   dexamethasone 6 MG tablet Commonly known as: DECADRON Take 1 tablet (6 mg total) by mouth daily. Start taking on: June 10, 2019   multivitamin with minerals tablet Take 1 tablet by mouth  daily.      Follow-up Information    Sandrea Hughsubio, Jessica, NP. Schedule an appointment as soon as possible for a visit in 3 week(s).   Specialty: Nurse Practitioner Contact information: [address] [place] [address] [phone]           Consultations:  None   Procedures/Studies:  Dg Chest Port 1  View  Result Date: 06/04/2019 CLINICAL DATA:  Shortness of breath. EXAM: PORTABLE CHEST 1 VIEW COMPARISON:  None. FINDINGS: The heart size and mediastinal contours are within normal limits. No pneumothorax or pleural effusion is noted. Multiple patchy airspace opacities are noted throughout both lungs. The visualized skeletal structures are unremarkable. IMPRESSION: Bilateral patchy airspace opacities are noted consistent with multifocal pneumonia. Electronically Signed   By: Lupita RaiderJames  Green Jr M.D.   On: 06/04/2019 14:20   [place] Ekg Site Rite  Result Date: 06/05/2019 If Site Rite image not attached, placement could not be confirmed due to current cardiac rhythm.  [place] Ekg Site Rite  Result Date: 06/05/2019 If Site Rite image not attached, placement could not be confirmed due to current cardiac rhythm.     Subjective: - no chest pain, shortness of breath, no abdominal pain, nausea or vomiting.  Stratus interpreter used  Discharge Exam: BP (!) 124/57 (BP Location: Left Arm)   Pulse 69   Temp 98.3 F (36.8 C) (Oral)   Resp 18   Ht 5' (1.524 m)   Wt 49.9 kg   LMP 08/30/2007   SpO2 96%   BMI 21.48 kg/m   General: Pt is alert, awake, not in acute distress Cardiovascular: RRR, S1/S2 +, no rubs, no gallops Respiratory: CTA bilaterally, no wheezing, no rhonchi Abdominal: Soft, NT, ND, bowel sounds + Extremities: no edema, no cyanosis    The results of significant diagnostics from this hospitalization (including imaging, microbiology, ancillary and laboratory) are listed below for reference.     Microbiology: Recent Results (from the past 240 hour(s))  Blood Culture (routine x 2)     Status: None   Collection Time: 06/04/19  2:08 PM   Specimen: BLOOD  Result Value Ref Range Status   Specimen Description BLOOD R WRIST  Final   Special Requests   Final    BOTTLES DRAWN AEROBIC AND ANAEROBIC Blood Culture results may not be optimal due to an excessive volume of blood received in culture  bottles   Culture   Final    NO GROWTH 5 DAYS Performed at [facility], [address] [place], [place] [address]    Report Status 06/09/2019 FINAL  Final  Blood Culture (routine x 2)     Status: None   Collection Time: 06/04/19  2:08 PM   Specimen: BLOOD  Result Value Ref Range Status   Specimen Description BLOOD L AC  Final   Special Requests   Final    BOTTLES DRAWN AEROBIC AND ANAEROBIC Blood Culture adequate volume   Culture   Final    NO GROWTH 5 DAYS Performed at [facility], [address] [place], [place] [address]    Report Status 06/09/2019 FINAL  Final     Labs: BNP (last 3 results) Recent Labs    06/04/19 2010  BNP 46.2   Basic Metabolic Panel: Recent Labs  Lab 06/05/19 0311 06/06/19 0317 06/07/19 0330 06/08/19 0225 06/09/19 0350  NA 139 140 140 139 138  K 3.9 4.0 3.8 3.9 4.0  CL 107 108 107 106 106  CO2 26 22  24 22 24   GLUCOSE 178* 165* 95 84 79  BUN 13 17 17 17 17   CREATININE 0.47 0.48 0.53 0.45 0.47  CALCIUM 9.3 8.8* 8.6* 8.7* 8.7*  MG 2.1  --   --   --   --   PHOS 3.7  --   --   --   --    Liver Function Tests: Recent Labs  Lab 06/05/19 0311 06/06/19 0317 06/07/19 0330 06/08/19 0225 06/09/19 0350  AST 36 29 22 43* 25  ALT 26 27 25  39 36  ALKPHOS 60 63 66 63 64  BILITOT 0.3 0.3 0.5 0.3 0.3  PROT 6.9 6.3* 6.2* 6.1* 5.9*  ALBUMIN 3.2* 3.1* 3.4* 3.2* 3.2*   No results for input(s): LIPASE, AMYLASE in the last 168 hours. No results for input(s): AMMONIA in the last 168 hours. CBC: Recent Labs  Lab 06/05/19 0311 06/06/19 0317 06/07/19 0330 06/08/19 0225 06/09/19 0350  WBC 2.9* 15.2* 12.4* 9.1 9.0  NEUTROABS 2.2 13.5* 9.7* 6.0 6.3  HGB 12.0 10.9* 11.4* 11.7* 11.6*  HCT 35.1* 32.8* 33.7* 35.4* 34.9*  MCV 90.2 90.4 90.1 90.1 89.9  PLT 382 420* 458* 477* 443*   Cardiac Enzymes: Recent Labs  Lab 06/05/19 0311  CKTOTAL 31*   BNP: Invalid input(s): POCBNP CBG: Recent Labs  Lab 06/08/19 0806 06/08/19  1228 06/08/19 1641 06/08/19 2058 06/09/19 0823  GLUCAP 85 131* 154* 169* 88   D-Dimer Recent Labs    06/08/19 0225 06/09/19 0350  DDIMER 0.44 0.35   Hgb A1c No results for input(s): HGBA1C in the last 72 hours. Lipid Profile No results for input(s): CHOL, HDL, LDLCALC, TRIG, CHOLHDL, LDLDIRECT in the last 72 hours. Thyroid function studies No results for input(s): TSH, T4TOTAL, T3FREE, THYROIDAB in the last 72 hours.  Invalid input(s): FREET3 Anemia work up Recent Labs    06/08/19 0225 06/09/19 0350  FERRITIN 630* 616*   Urinalysis No results found for: COLORURINE, APPEARANCEUR, LABSPEC, [place], GLUCOSEU, HGBUR, BILIRUBINUR, KETONESUR, PROTEINUR, UROBILINOGEN, NITRITE, LEUKOCYTESUR Sepsis Labs Invalid input(s): PROCALCITONIN,  WBC,  LACTICIDVEN  FURTHER DISCHARGE INSTRUCTIONS:   Get Medicines reviewed and adjusted: Please take all your medications with you for your next visit with your Primary MD   Laboratory/radiological data: Please request your Primary MD to go over all hospital tests and procedure/radiological results at the follow up, please ask your Primary MD to get all Hospital records sent to his/her office.   In some cases, they will be blood work, cultures and biopsy results pending at the time of your discharge. Please request that your primary care M.D. goes through all the records of your hospital data and follows up on these results.   Also Note the following: If you experience worsening of your admission symptoms, develop shortness of breath, life threatening emergency, suicidal or homicidal thoughts you must seek medical attention immediately by calling 911 or calling your MD immediately  if symptoms less severe.   You must read complete instructions/literature along with all the possible adverse reactions/side effects for all the Medicines you take and that have been prescribed to you. Take any new Medicines after you have completely understood and  accpet all the possible adverse reactions/side effects.    Do not drive when taking Pain medications or sleeping medications (Benzodaizepines)   Do not take more than prescribed Pain, Sleep and Anxiety Medications. It is not advisable to combine anxiety,sleep and pain medications without talking with your primary care practitioner   Special Instructions: If you  have smoked or chewed Tobacco  in the last 2 yrs please stop smoking, stop any regular Alcohol  and or any Recreational drug use.   Wear Seat belts while driving.   Please note: You were cared for by a hospitalist during your hospital stay. Once you are discharged, your primary care physician will handle any further medical issues. Please note that NO REFILLS for any discharge medications will be authorized once you are discharged, as it is imperative that you return to your primary care physician (or establish a relationship with a primary care physician if you do not have one) for your post hospital discharge needs so that they can reassess your need for medications and monitor your lab values.  Time coordinating discharge: 40 minutes  SIGNED:  Pamella Pertostin Gherghe, MD, PhD 06/09/2019, 10:23 AM

## 2019-09-17 ENCOUNTER — Other Ambulatory Visit: Payer: Self-pay | Admitting: Primary Care

## 2019-09-17 DIAGNOSIS — Z1231 Encounter for screening mammogram for malignant neoplasm of breast: Secondary | ICD-10-CM

## 2020-02-04 ENCOUNTER — Ambulatory Visit: Payer: No Typology Code available for payment source | Attending: Internal Medicine

## 2020-02-04 ENCOUNTER — Ambulatory Visit: Payer: No Typology Code available for payment source

## 2020-02-04 DIAGNOSIS — Z23 Encounter for immunization: Secondary | ICD-10-CM

## 2020-02-04 NOTE — Progress Notes (Signed)
   Covid-19 Vaccination Clinic  Name:  Sherri Mason    MRN: [id number] DOB: 10-15-1956  02/04/2020  Sherri Mason was observed post Covid-19 immunization for 15 minutes without incident. She was provided with Vaccine Information Sheet and instruction to access the V-Safe system.   Sherri Mason was instructed to call 911 with any severe reactions post vaccine: Marland Kitchen Difficulty breathing  . Swelling of face and throat  . A fast heartbeat  . A bad rash all over body  . Dizziness and weakness   Immunizations Administered    Name Date Dose VIS Date Route   Pfizer COVID-19 Vaccine 02/04/2020  4:21 PM 0.3 mL 10/29/2019 Intramuscular   Manufacturer: [employer], [employer]   Lot: [id number]   NDC: [address]-8

## 2020-02-25 ENCOUNTER — Other Ambulatory Visit: Payer: Self-pay

## 2020-02-25 ENCOUNTER — Ambulatory Visit: Payer: No Typology Code available for payment source | Attending: Internal Medicine

## 2020-02-25 DIAGNOSIS — Z23 Encounter for immunization: Secondary | ICD-10-CM

## 2020-02-25 NOTE — Progress Notes (Signed)
   Covid-19 Vaccination Clinic  Name:  Sherri Mason    MRN: [id number] DOB: February 12, 1956  02/25/2020  Ms. Sherri Mason was observed post Covid-19 immunization for 15 minutes without incident. She was provided with Vaccine Information Sheet and instruction to access the V-Safe system.   Ms. Sherri Mason was instructed to call 911 with any severe reactions post vaccine: Marland Kitchen Difficulty breathing  . Swelling of face and throat  . A fast heartbeat  . A bad rash all over body  . Dizziness and weakness   Immunizations Administered    Name Date Dose VIS Date Route   Pfizer COVID-19 Vaccine 02/25/2020 12:00 AM 0.3 mL 10/29/2019 Intramuscular   Manufacturer: [employer], [employer]   Lot: [id number]   NDC: [address]-2

## 2020-10-20 IMAGING — DX PORTABLE CHEST - 1 VIEW
1 series · 1 of 1 positions shown · non-contrast
Comparison: None.

CLINICAL DATA: Shortness of breath.

EXAM:
PORTABLE CHEST 1 VIEW

[chest ap]
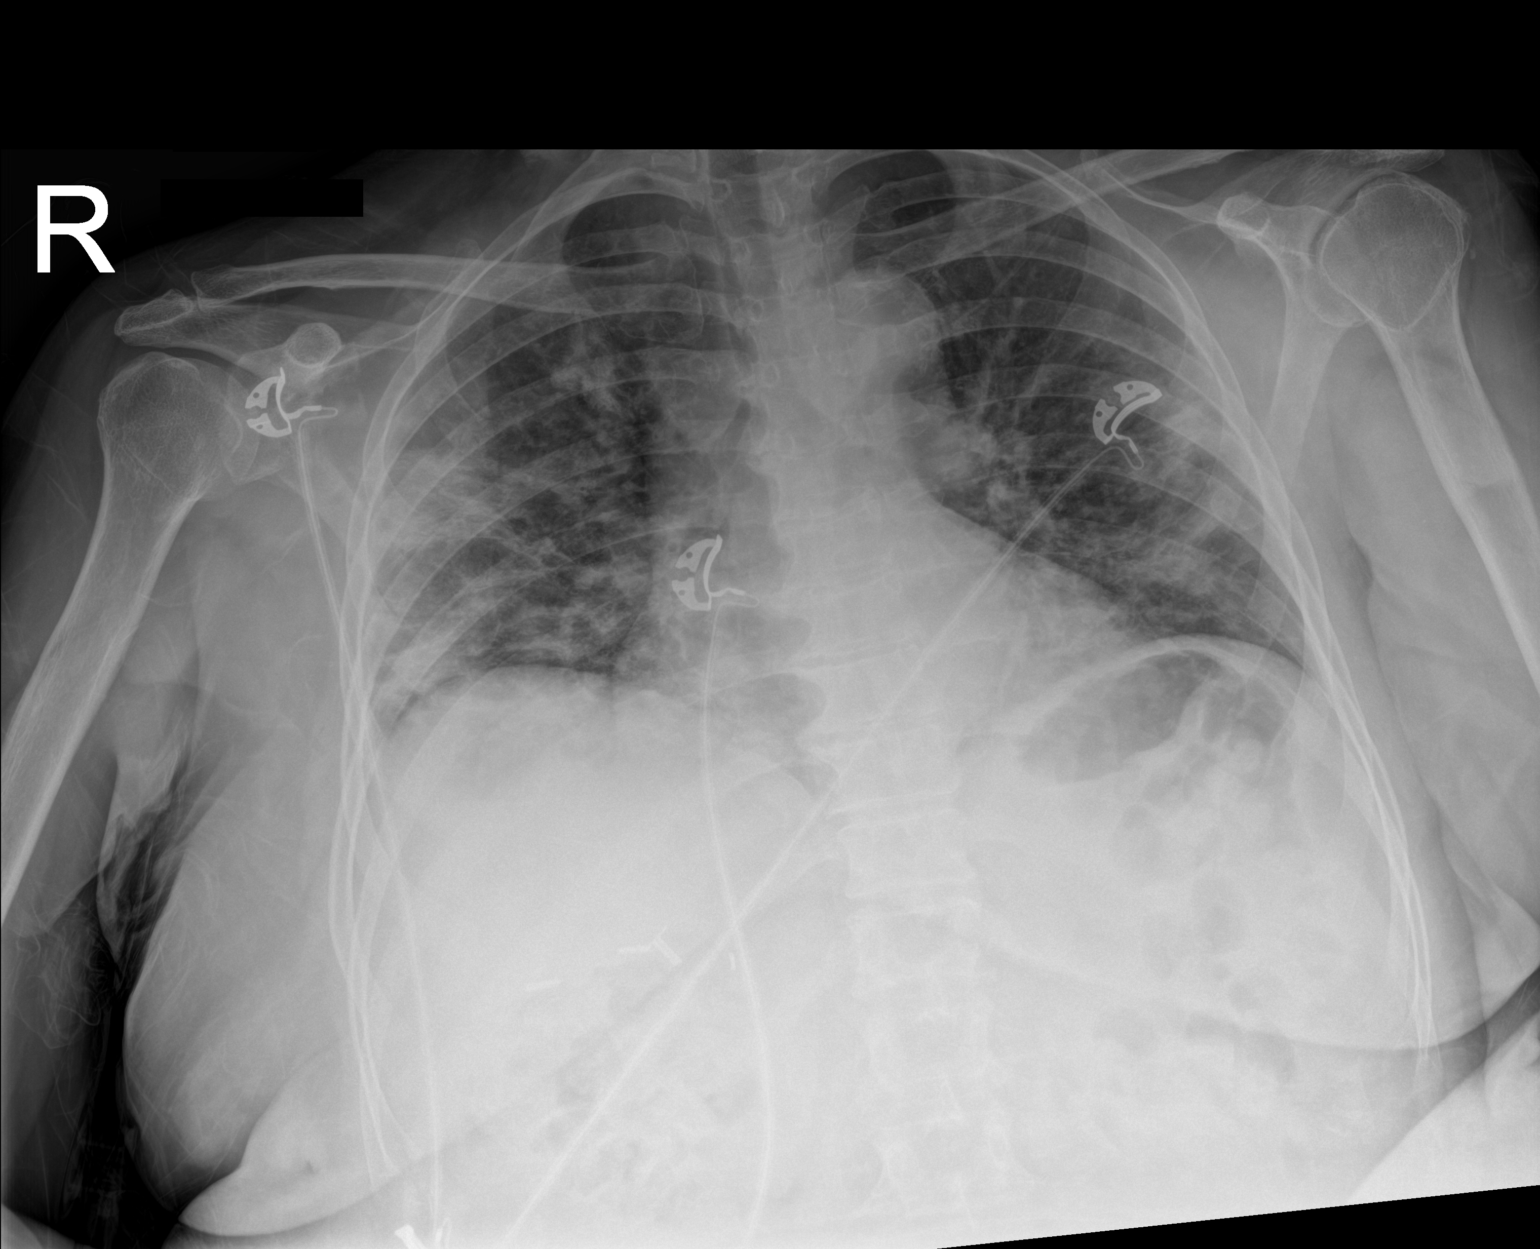

[1 of 1 positions shown; findings below may reference images not displayed]

FINDINGS: The heart size and mediastinal contours are within normal limits. No
pneumothorax or pleural effusion is noted. Multiple patchy airspace
opacities are noted throughout both lungs. The visualized skeletal
structures are unremarkable.
IMPRESSION: Bilateral patchy airspace opacities are noted consistent with
multifocal pneumonia.

## 2022-02-12 ENCOUNTER — Encounter: Payer: Self-pay | Admitting: Primary Care

## 2023-07-04 ENCOUNTER — Other Ambulatory Visit: Payer: Self-pay

## 2023-07-04 ENCOUNTER — Emergency Department
Admission: EM | Admit: 2023-07-04 | Discharge: 2023-07-04 | Disposition: A | Discharge status: Home / Self Care | Payer: Medicaid Other | Attending: Emergency Medicine | Admitting: Emergency Medicine

## 2023-07-04 DIAGNOSIS — R059 Cough, unspecified: Secondary | ICD-10-CM | POA: Diagnosis present

## 2023-07-04 DIAGNOSIS — Z20822 Contact with and (suspected) exposure to covid-19: Secondary | ICD-10-CM | POA: Diagnosis not present

## 2023-07-04 DIAGNOSIS — J069 Acute upper respiratory infection, unspecified: Secondary | ICD-10-CM | POA: Insufficient documentation

## 2023-07-04 LAB — RESP PANEL BY RT-PCR (RSV, FLU A&B, COVID)  RVPGX2
Influenza A by PCR: NEGATIVE
Influenza B by PCR: NEGATIVE
Resp Syncytial Virus by PCR: NEGATIVE
SARS Coronavirus 2 by RT PCR: NEGATIVE

## 2023-07-04 NOTE — ED Provider Notes (Signed)
[facility] Provider Note    Event Date/Time   First MD Initiated Contact with Patient 07/04/23 1224     (approximate)   History   No chief complaint on file.   HPI  Kaiya Ayen Diggs is a 67 y.o. female with PMH of acute on chronic respiratory failure with hypoxia presents for evaluation of a cough and general malaise.  Patient has been symptomatic for 6 days.  She was tested at an urgent care clinic 4 days ago and was negative for COVID and flu.  Patient was given albuterol and Tessalon Perles which she states has not been effective.  She denies any fever.  She has a sore throat and some chest pain from her coughing.      Physical Exam   Triage Vital Signs: ED Triage Vitals  Encounter Vitals Group     BP 07/04/23 1202 124/77     Systolic BP Percentile --      Diastolic BP Percentile --      Pulse Rate 07/04/23 1202 78     Resp 07/04/23 1202 16     Temp 07/04/23 1202 98.9 F (37.2 C)     Temp Source 07/04/23 1202 Oral     SpO2 07/04/23 1202 99 %     Weight 07/04/23 1203 110 lb 0.2 oz (49.9 kg)     Height 07/04/23 1203 5' (1.524 m)     Head Circumference --      Peak Flow --      Pain Score 07/04/23 1203 0     Pain Loc --      Pain Education --      Exclude from Growth Chart --     Most recent vital signs: Vitals:   07/04/23 1202  BP: 124/77  Pulse: 78  Resp: 16  Temp: 98.9 F (37.2 C)  SpO2: 99%    General: Awake, mild distress, coughing frequently. CV:  Good peripheral perfusion.  RRR. Resp:  Normal effort.  CTAB. Abd:  No distention.  Other:  Oral mucous membranes are moist, no pharyngeal erythema or tonsillar enlargement.  No tender lymphadenopathy.   ED Results / Procedures / Treatments   Labs (all labs ordered are listed, but only abnormal results are displayed) Labs Reviewed  RESP PANEL BY RT-PCR (RSV, FLU A&B, COVID)  [id number]     PROCEDURES:  Critical Care performed: No  Procedures   MEDICATIONS  ORDERED IN ED: Medications - No data to display   IMPRESSION / MDM / ASSESSMENT AND PLAN / ED COURSE  I reviewed the triage vital signs and the nursing notes.                             67 year old female presents for evaluation of a cough x 6 days.  VSS in triage and patient nontoxic-appearing on exam.  Differential diagnosis includes, but is not limited to, COVID, flu, viral URI, bronchitis, pneumonia.  Patient's presentation is most consistent with acute, uncomplicated illness.  Respiratory panel repeated today, which was negative.  Given that patient does not have a fever and her pulse ox on room air is 99% I do not suspect pneumonia and therefore do not feel chest x-ray is warranted at this time.  I believe patient has a viral illness.  I will advise her on symptomatic management.  Patient voiced understanding, all questions were answered and she was stable at discharge.    FINAL  CLINICAL IMPRESSION(S) / ED DIAGNOSES   Final diagnoses:  Viral URI with cough     Rx / DC Orders   ED Discharge Orders     None        Note:  This document was prepared using Dragon voice recognition software and may include unintentional dictation errors.   Cameron Ali, PA-C 07/04/23 1455    Chesley Noon, MD 07/04/23 [phone]

## 2023-07-04 NOTE — Discharge Instructions (Signed)
Can use over-the-counter cold medications to manage her symptoms.  I believe you have a viral illness which will resolve on its own.  If your symptoms last longer than 2 weeks please return to the ED or urgent care to be evaluated.  If you develop a fever with difficulty breathing please return to the ED.

## 2023-07-04 NOTE — ED Triage Notes (Signed)
C/O 6 days of not feeling good.  Went to the clinic (4 days ago) and they said everything was fine.  Covid and Flu test negative. Given albuterol and Benzonatate, not feeling better.  C/O cough productive white sputum and sinus congestion.   AAOx3.  Skin warm and dry. NAD

## 2023-07-29 ENCOUNTER — Other Ambulatory Visit: Payer: Self-pay | Admitting: Family Medicine

## 2023-07-29 DIAGNOSIS — R059 Cough, unspecified: Secondary | ICD-10-CM

## 2023-08-20 ENCOUNTER — Other Ambulatory Visit: Payer: Self-pay | Admitting: Primary Care

## 2023-08-20 DIAGNOSIS — Z1231 Encounter for screening mammogram for malignant neoplasm of breast: Secondary | ICD-10-CM

## 2023-08-26 ENCOUNTER — Other Ambulatory Visit: Payer: Self-pay | Admitting: Primary Care

## 2023-08-26 ENCOUNTER — Ambulatory Visit
Admission: RE | Admit: 2023-08-26 | Discharge: 2023-08-26 | Disposition: A | Discharge status: Home / Self Care | Payer: Medicaid Other | Source: Ambulatory Visit | Attending: Family Medicine | Admitting: Family Medicine

## 2023-08-26 ENCOUNTER — Ambulatory Visit
Admission: RE | Admit: 2023-08-26 | Discharge: 2023-08-26 | Disposition: A | Discharge status: Home / Self Care | Payer: Medicaid Other | Source: Ambulatory Visit | Attending: Primary Care

## 2023-08-26 ENCOUNTER — Other Ambulatory Visit
Admission: RE | Admit: 2023-08-26 | Discharge: 2023-08-26 | Disposition: A | Discharge status: Home / Self Care | Payer: Medicaid Other | Source: Home / Self Care | Attending: Primary Care | Admitting: Primary Care

## 2023-08-26 DIAGNOSIS — M545 Low back pain, unspecified: Secondary | ICD-10-CM

## 2023-08-26 DIAGNOSIS — R059 Cough, unspecified: Secondary | ICD-10-CM | POA: Diagnosis present

## 2023-11-06 ENCOUNTER — Ambulatory Visit
Admission: RE | Admit: 2023-11-06 | Discharge: 2023-11-06 | Disposition: A | Discharge status: Home / Self Care | Payer: Medicaid Other | Source: Ambulatory Visit | Attending: Primary Care | Admitting: Primary Care

## 2023-11-06 DIAGNOSIS — Z1231 Encounter for screening mammogram for malignant neoplasm of breast: Secondary | ICD-10-CM | POA: Insufficient documentation

## 2024-03-31 ENCOUNTER — Encounter: Payer: Self-pay | Admitting: Gastroenterology

## 2024-04-02 ENCOUNTER — Encounter: Payer: Self-pay | Admitting: Emergency Medicine

## 2024-04-02 ENCOUNTER — Emergency Department
Admission: EM | Admit: 2024-04-02 | Discharge: 2024-04-02 | Disposition: A | Discharge status: Home / Self Care | Attending: Emergency Medicine | Admitting: Emergency Medicine

## 2024-04-02 ENCOUNTER — Emergency Department

## 2024-04-02 ENCOUNTER — Other Ambulatory Visit: Payer: Self-pay

## 2024-04-02 DIAGNOSIS — D259 Leiomyoma of uterus, unspecified: Secondary | ICD-10-CM | POA: Insufficient documentation

## 2024-04-02 DIAGNOSIS — R1031 Right lower quadrant pain: Secondary | ICD-10-CM | POA: Diagnosis present

## 2024-04-02 DIAGNOSIS — R1030 Lower abdominal pain, unspecified: Secondary | ICD-10-CM

## 2024-04-02 DIAGNOSIS — D219 Benign neoplasm of connective and other soft tissue, unspecified: Secondary | ICD-10-CM

## 2024-04-02 LAB — URINALYSIS, ROUTINE W REFLEX MICROSCOPIC
Bacteria, UA: NONE SEEN
Bilirubin Urine: NEGATIVE
Glucose, UA: NEGATIVE mg/dL
Hgb urine dipstick: NEGATIVE
Ketones, ur: NEGATIVE mg/dL
Leukocytes,Ua: NEGATIVE
Nitrite: POSITIVE — AB
Protein, ur: NEGATIVE mg/dL
Specific Gravity, Urine: 1.012 (ref 1.005–1.030)
pH: 5 (ref 5.0–8.0)

## 2024-04-02 LAB — COMPREHENSIVE METABOLIC PANEL WITH GFR
ALT: 16 U/L (ref 0–44)
AST: 22 U/L (ref 15–41)
Albumin: 4 g/dL (ref 3.5–5.0)
Alkaline Phosphatase: 54 U/L (ref 38–126)
Anion gap: 9 (ref 5–15)
BUN: 12 mg/dL (ref 8–23)
CO2: 21 mmol/L — ABNORMAL LOW (ref 22–32)
Calcium: 9.4 mg/dL (ref 8.9–10.3)
Chloride: 108 mmol/L (ref 98–111)
Creatinine, Ser: 0.53 mg/dL (ref 0.44–1.00)
GFR, Estimated: >60 mL/min (ref >60)
Glucose, Bld: 101 mg/dL — ABNORMAL HIGH (ref 70–99)
Potassium: 3.9 mmol/L (ref 3.5–5.1)
Sodium: 138 mmol/L (ref 135–145)
Total Bilirubin: 0.9 mg/dL (ref 0.0–1.2)
Total Protein: 6.8 g/dL (ref 6.5–8.1)

## 2024-04-02 LAB — CBC
HCT: 38.9 % (ref 36.0–46.0)
Hemoglobin: 13.4 g/dL (ref 12.0–15.0)
MCH: 31 pg (ref 26.0–34.0)
MCHC: 34.4 g/dL (ref 30.0–36.0)
MCV: 90 fL (ref 80.0–100.0)
Platelets: 228 10*3/uL (ref 150–400)
RBC: 4.32 MIL/uL (ref 3.87–5.11)
RDW: 12.6 % (ref 11.5–15.5)
WBC: 4.7 10*3/uL (ref 4.0–10.5)
nRBC: 0 % (ref 0.0–0.2)

## 2024-04-02 LAB — LIPASE, BLOOD: Lipase: 24 U/L (ref 11–51)

## 2024-04-02 MED ORDER — NAPROXEN 500 MG PO TABS: 500.0000 mg | ORAL_TABLET | Freq: Two times a day (BID) | ORAL | 0 refills | Status: AC

## 2024-04-02 MED ORDER — IOHEXOL 300 MG/ML  SOLN
100.0000 mL | Freq: Once | INTRAMUSCULAR | Status: AC | PRN
  Administered 2024-04-02: 100 mL via INTRAVENOUS

## 2024-04-02 NOTE — Discharge Instructions (Signed)
 I have sent medication to your pharmacy to help with your pain symptoms.  Please follow-up with gynecology and your primary care provider for reassessment.  Please return for any severe worsening symptoms.

## 2024-04-02 NOTE — ED Provider Notes (Signed)
 [facility] Provider Note    Event Date/Time   First MD Initiated Contact with Patient 04/02/24 1155     (approximate)   History   Abdominal Pain   HPI Sherri Mason is a 68 y.o. female with history of GERD presenting today for abdominal pain.  Patient states she has been having abdominal pain for the past 6 days.  Notices it worse when she is trying to have a bowel movement.  Pain is mostly in her right lower quadrant.  Also having some burning pain when she urinates.  Currently on Flomax and Azo.  Prior abdominal surgeries include cholecystectomy and appendectomy.  Denies nausea, vomiting, fever, chills, chest pain, shortness of breath.     Physical Exam   Triage Vital Signs: ED Triage Vitals  Encounter Vitals Group     BP 04/02/24 1117 122/71     Systolic BP Percentile --      Diastolic BP Percentile --      Pulse Rate 04/02/24 1117 87     Resp 04/02/24 1117 16     Temp 04/02/24 1117 99.2 F (37.3 C)     Temp Source 04/02/24 1117 Oral     SpO2 04/02/24 1117 95 %     Weight 04/02/24 1118 120 lb (54.4 kg)     Height 04/02/24 1118 5' (1.524 m)     Head Circumference --      Peak Flow --      Pain Score 04/02/24 1118 10     Pain Loc --      Pain Education --      Exclude from Growth Chart --     Most recent vital signs: Vitals:   04/02/24 1117  BP: 122/71  Pulse: 87  Resp: 16  Temp: 99.2 F (37.3 C)  SpO2: 95%   Physical Exam: I have reviewed the vital signs and nursing notes. General: Awake, alert, no acute distress.  Nontoxic appearing. Head:  Atraumatic, normocephalic.   ENT:  EOM intact, PERRL. Oral mucosa is pink and moist with no lesions. Neck: Neck is supple with full range of motion, No meningeal signs. Cardiovascular:  RRR, No murmurs. Peripheral pulses palpable and equal bilaterally. Respiratory:  Symmetrical chest wall expansion.  No rhonchi, rales, or wheezes.  Good air movement throughout.  No use of  accessory muscles.   Musculoskeletal:  No cyanosis or edema. Moving extremities with full ROM Abdomen:  Soft, tenderness to palpation in right lower quadrant and suprapubic region, nondistended. Neuro:  GCS 15, moving all four extremities, interacting appropriately. Speech clear. Psych:  Calm, appropriate.   Skin:  Warm, dry, no rash.    ED Results / Procedures / Treatments   Labs (all labs ordered are listed, but only abnormal results are displayed) Labs Reviewed  COMPREHENSIVE METABOLIC PANEL WITH GFR - Abnormal; Notable for the following components:      Result Value   CO2 21 (*)    Glucose, Bld 101 (*)    All other components within normal limits  URINALYSIS, ROUTINE W REFLEX MICROSCOPIC - Abnormal; Notable for the following components:   Color, Urine AMBER (*)    APPearance CLEAR (*)    Nitrite POSITIVE (*)    All other components within normal limits  LIPASE, BLOOD  CBC     EKG    RADIOLOGY Independently interpreted CT imaging with no acute pathology but there is evidence of leiomyoma   PROCEDURES:  Critical Care performed: No  Procedures  MEDICATIONS ORDERED IN ED: Medications  iohexol (OMNIPAQUE) 300 MG/ML solution 100 mL (100 mLs Intravenous Contrast Given 04/02/24 1334)     IMPRESSION / MDM / ASSESSMENT AND PLAN / ED COURSE  I reviewed the triage vital signs and the nursing notes.                              Differential diagnosis includes, but is not limited to, acute cystitis, constipation, SBO, diverticulitis, colitis, enteritis  Patient's presentation is most consistent with acute complicated illness / injury requiring diagnostic workup.  Patient is a 68 year old female presenting today for 6 days of worsening lower abdominal pain associated with dysuria.  Is notably tender in her right lower quadrant and suprapubic regions here on exam.  UA does not show obvious indication of UTI and has nitrites, however patient is on Azo which likely  explains this.  CBC, CMP, lipase all negative.  CT imaging ordered for further evaluation.  CT imaging showed no acute pathology but there is evidence of leiomyoma.  This can cause lower pelvic pain.  Without any other acute findings, will start on NSAIDs and have her follow-up with gynecology.  Told to follow-up with PCP as well as given strict return precautions.     FINAL CLINICAL IMPRESSION(S) / ED DIAGNOSES   Final diagnoses:  Leiomyoma  Lower abdominal pain     Rx / DC Orders   ED Discharge Orders          Ordered    naproxen (NAPROSYN) 500 MG tablet  2 times daily with meals        04/02/24 1502    Ambulatory referral to Gynecology        04/02/24 1503             Note:  This document was prepared using Dragon voice recognition software and may include unintentional dictation errors.   Kandee Orion, MD 04/02/24 [phone]

## 2024-04-02 NOTE — ED Triage Notes (Addendum)
 Pt reports that she has been hurting for 6 days, pt went to [facility] and was sent to her, states that when she pushes to have a bm it hurts, states that the pain in her abd is rlq and around to her hips and tail bone, states a lot of pressure when she pushes her urine out with burning. Pt is currently on flomax and azo Reports gallbladder and appendectomy surg Ipad interpreter used for triage

## 2024-04-06 ENCOUNTER — Other Ambulatory Visit: Payer: Self-pay | Admitting: Primary Care

## 2024-04-06 DIAGNOSIS — R300 Dysuria: Secondary | ICD-10-CM

## 2024-04-06 DIAGNOSIS — R350 Frequency of micturition: Secondary | ICD-10-CM

## 2024-04-06 DIAGNOSIS — R102 Pelvic and perineal pain: Secondary | ICD-10-CM

## 2024-04-08 ENCOUNTER — Encounter: Payer: Self-pay | Admitting: Gastroenterology

## 2024-04-08 ENCOUNTER — Ambulatory Visit: Admitting: General Practice

## 2024-04-08 ENCOUNTER — Encounter
Admission: RE | Disposition: A | Discharge status: Home / Self Care | Payer: Self-pay | Source: Home / Self Care | Attending: Gastroenterology

## 2024-04-08 ENCOUNTER — Ambulatory Visit
Admission: RE | Admit: 2024-04-08 | Discharge: 2024-04-08 | Disposition: A | Discharge status: Home / Self Care | Attending: Gastroenterology | Admitting: Gastroenterology

## 2024-04-08 ENCOUNTER — Other Ambulatory Visit: Payer: Self-pay

## 2024-04-08 DIAGNOSIS — K259 Gastric ulcer, unspecified as acute or chronic, without hemorrhage or perforation: Secondary | ICD-10-CM | POA: Diagnosis not present

## 2024-04-08 DIAGNOSIS — I10 Essential (primary) hypertension: Secondary | ICD-10-CM | POA: Diagnosis not present

## 2024-04-08 DIAGNOSIS — J45909 Unspecified asthma, uncomplicated: Secondary | ICD-10-CM | POA: Diagnosis not present

## 2024-04-08 DIAGNOSIS — D123 Benign neoplasm of transverse colon: Secondary | ICD-10-CM | POA: Diagnosis not present

## 2024-04-08 DIAGNOSIS — Z79899 Other long term (current) drug therapy: Secondary | ICD-10-CM | POA: Diagnosis not present

## 2024-04-08 DIAGNOSIS — Z1211 Encounter for screening for malignant neoplasm of colon: Secondary | ICD-10-CM | POA: Diagnosis not present

## 2024-04-08 DIAGNOSIS — R1013 Epigastric pain: Secondary | ICD-10-CM | POA: Diagnosis present

## 2024-04-08 DIAGNOSIS — K2980 Duodenitis without bleeding: Secondary | ICD-10-CM | POA: Diagnosis not present

## 2024-04-08 DIAGNOSIS — K2970 Gastritis, unspecified, without bleeding: Secondary | ICD-10-CM | POA: Diagnosis not present

## 2024-04-08 DIAGNOSIS — D122 Benign neoplasm of ascending colon: Secondary | ICD-10-CM | POA: Insufficient documentation

## 2024-04-08 DIAGNOSIS — Z7952 Long term (current) use of systemic steroids: Secondary | ICD-10-CM | POA: Insufficient documentation

## 2024-04-08 HISTORY — DX: Major depressive disorder, recurrent, unspecified: F33.9

## 2024-04-08 HISTORY — DX: Trigger finger, right middle finger: M65.331

## 2024-04-08 HISTORY — DX: Unspecified osteoarthritis, unspecified site: M19.90

## 2024-04-08 HISTORY — DX: Essential (primary) hypertension: I10

## 2024-04-08 HISTORY — PX: COLONOSCOPY: SHX5424

## 2024-04-08 HISTORY — DX: Irritable bowel syndrome, unspecified: K58.9

## 2024-04-08 HISTORY — DX: Menopausal and female climacteric states: N95.1

## 2024-04-08 HISTORY — DX: Gastro-esophageal reflux disease without esophagitis: K21.9

## 2024-04-08 HISTORY — PX: ESOPHAGOGASTRODUODENOSCOPY: SHX5428

## 2024-04-08 SURGERY — COLONOSCOPY
Anesthesia: General

## 2024-04-08 MED ORDER — ACETAMINOPHEN 500 MG PO TABS
500.0000 mg | ORAL_TABLET | Freq: Once | ORAL | Status: AC
  Administered 2024-04-08: 500 mg via ORAL

## 2024-04-08 MED ORDER — ACETAMINOPHEN 500 MG PO TABS
ORAL_TABLET | ORAL | Status: AC
  Filled 2024-04-08: qty 1

## 2024-04-08 MED ORDER — SODIUM CHLORIDE 0.9 % IV SOLN: INTRAVENOUS | Status: DC | PRN

## 2024-04-08 MED ORDER — LIDOCAINE HCL (CARDIAC) PF 100 MG/5ML IV SOSY
PREFILLED_SYRINGE | INTRAVENOUS | Status: DC | PRN
  Administered 2024-04-08 (×2): 100 mg via INTRAVENOUS

## 2024-04-08 MED ORDER — ESMOLOL HCL 100 MG/10ML IV SOLN
INTRAVENOUS | Status: DC | PRN
Start: 2024-04-08 — End: 2024-04-08
  Administered 2024-04-08: 30 mg via INTRAVENOUS

## 2024-04-08 MED ORDER — PROPOFOL 500 MG/50ML IV EMUL
INTRAVENOUS | Status: DC | PRN
  Administered 2024-04-08: 50 mg via INTRAVENOUS
  Administered 2024-04-08: 150 ug/kg/min via INTRAVENOUS

## 2024-04-08 NOTE — Interval H&P Note (Signed)
 History and Physical Interval Note: Preprocedure H&P from 04/08/24  was reviewed and there was no interval change after seeing and examining the patient.  Written consent was obtained from the patient after discussion of risks, benefits, and alternatives. Patient has consented to proceed with Esophagogastroduodenoscopy and Colonoscopy with possible intervention   04/08/2024 1:05 PM  Sherri Mason  has presented today for surgery, with the diagnosis of Gastroesophageal reflux disease, unspecified whether esophagitis present (K21.9) Colon cancer screening (Z12.11) Nausea (R11.0).  The various methods of treatment have been discussed with the patient and family. After consideration of risks, benefits and other options for treatment, the patient has consented to  Procedure(s) with comments: COLONOSCOPY (N/A) - Spanish Interpreter needed EGD (ESOPHAGOGASTRODUODENOSCOPY) (N/A) as a surgical intervention.  The patient's history has been reviewed, patient examined, no change in status, stable for surgery.  I have reviewed the patient's chart and labs.  Questions were answered to the patient's satisfaction.     Quintin Buckle

## 2024-04-08 NOTE — Anesthesia Preprocedure Evaluation (Signed)
 Anesthesia Evaluation  Patient identified by MRN, date of birth, ID band Patient awake    Reviewed: Allergy & Precautions, NPO status , Patient's Chart, lab work & pertinent test results  Airway Mallampati: III  TM Distance: >3 FB Neck ROM: full    Dental  (+) Chipped   Pulmonary asthma    Pulmonary exam normal        Cardiovascular hypertension, negative cardio ROS Normal cardiovascular exam     Neuro/Psych  PSYCHIATRIC DISORDERS      negative neurological ROS     GI/Hepatic negative GI ROS, Neg liver ROS,,,  Endo/Other  negative endocrine ROS    Renal/GU negative Renal ROS  negative genitourinary   Musculoskeletal   Abdominal   Peds  Hematology negative hematology ROS (+)   Anesthesia Other Findings Past Medical History: No date: GERD (gastroesophageal reflux disease) No date: Hypertension No date: Irritable bowel syndrome No date: Major depressive disorder, recurrent episode (HCC) No date: Menopausal syndrome No date: Osteoarthritis No date: Trigger finger, right middle finger  Past Surgical History: No date: CHOLECYSTECTOMY     Reproductive/Obstetrics negative OB ROS                             Anesthesia Physical Anesthesia Plan  ASA: 2  Anesthesia Plan: General   Post-op Pain Management: Minimal or no pain anticipated   Induction: Intravenous  PONV Risk Score and Plan: 3 and Propofol infusion, TIVA and Ondansetron   Airway Management Planned: Nasal Cannula  Additional Equipment: None  Intra-op Plan:   Post-operative Plan:   Informed Consent: I have reviewed the patients History and Physical, chart, labs and discussed the procedure including the risks, benefits and alternatives for the proposed anesthesia with the patient or authorized representative who has indicated his/her understanding and acceptance.     Dental advisory given and Interpreter used for  interview  Plan Discussed with: CRNA and Surgeon  Anesthesia Plan Comments: (Discussed risks of anesthesia with patient, including possibility of difficulty with spontaneous ventilation under anesthesia necessitating airway intervention, PONV, and rare risks such as cardiac or respiratory or neurological events, and allergic reactions. Discussed the role of CRNA in patient's perioperative care. Patient understands.)       Anesthesia Quick Evaluation

## 2024-04-08 NOTE — Op Note (Signed)
 [facility] Patient Name: Sherri Mason Procedure Date: 04/08/2024 1:08 PM MRN: [id number] Account #: [id number] Date of Birth: Sep 20, 1956 Admit Type: Outpatient Age: 68 Room: [facility] ENDO ROOM 1 Gender: Female Note Status: Finalized Instrument Name: Peds Colonoscope [id number] Procedure:             Colonoscopy Indications:           Screening for colorectal malignant neoplasm Providers:             Quintin Buckle DO, DO Referring MD:          Willa Haring (Referring MD) Medicines:             Monitored Anesthesia Care Complications:         No immediate complications. Estimated blood loss:                         Minimal. Procedure:             Pre-Anesthesia Assessment:                        - Prior to the procedure, a History and Physical was                         performed, and patient medications and allergies were                         reviewed. The patient is competent. The risks and                         benefits of the procedure and the sedation options and                         risks were discussed with the patient. All questions                         were answered and informed consent was obtained.                         Patient identification and proposed procedure were                         verified by the physician, the nurse, the anesthetist                         and the technician in the endoscopy suite. Mental                         Status Examination: alert and oriented. Airway                         Examination: normal oropharyngeal airway and neck                         mobility. Respiratory Examination: clear to                         auscultation. CV Examination: RRR, no murmurs, no S3  or S4. Prophylactic Antibiotics: The patient does not                         require prophylactic antibiotics. Prior                         Anticoagulants: The patient has taken no  anticoagulant                         or antiplatelet agents. ASA Grade Assessment: II - A                         patient with mild systemic disease. After reviewing                         the risks and benefits, the patient was deemed in                         satisfactory condition to undergo the procedure. The                         anesthesia plan was to use monitored anesthesia care                         (MAC). Immediately prior to administration of                         medications, the patient was re-assessed for adequacy                         to receive sedatives. The heart rate, respiratory                         rate, oxygen saturations, blood pressure, adequacy of                         pulmonary ventilation, and response to care were                         monitored throughout the procedure. The physical                         status of the patient was re-assessed after the                         procedure.                        After obtaining informed consent, the colonoscope was                         passed under direct vision. Throughout the procedure,                         the patient's blood pressure, pulse, and oxygen                         saturations were monitored continuously. The  Colonoscope was introduced through the anus and                         advanced to the the terminal ileum, with                         identification of the appendiceal orifice and IC                         valve. The colonoscopy was performed without                         difficulty. The patient tolerated the procedure well.                         The quality of the bowel preparation was evaluated                         using the BBPS [facility] Bowel Preparation Scale) with                         scores of: Right Colon = 3, Transverse Colon = 3 and                         Left Colon = 3 (entire mucosa seen well with no                          residual staining, small fragments of stool or opaque                         liquid). The total BBPS score equals 9. The terminal                         ileum, ileocecal valve, appendiceal orifice, and                         rectum were photographed. Findings:      The perianal and digital rectal examinations were normal. Pertinent       negatives include normal sphincter tone.      The terminal ileum appeared normal. Estimated blood loss: none.      Retroflexion in the right colon was performed.      Two sessile polyps were found in the transverse colon and ascending       colon. The polyps were 1 to 2 mm in size. These polyps were removed with       a jumbo cold forceps. Resection and retrieval were complete. Estimated       blood loss was minimal.      The exam was otherwise without abnormality on direct and retroflexion       views. Impression:            - The examined portion of the ileum was normal.                        - Two 1 to 2 mm polyps in the transverse colon and in  the ascending colon, removed with a jumbo cold                         forceps. Resected and retrieved.                        - The examination was otherwise normal on direct and                         retroflexion views.                        -                        - La porcin examinada del leon fue normal.                        - Dos plipos de 1 a 2 mm en el colon transverso y en                         el colon ascendente, extirpados con pinzas fras                         jumbo. Resecados y recuperados.                        - El resto del examen fue normal en proyecciones                         directas y en retroflexin. Recommendation:        - Patient has a contact number available for                         emergencies. The signs and symptoms of potential                         delayed complications were discussed with the patient.                          Return to normal activities tomorrow. Written                         discharge instructions were provided to the patient.                        - Resume previous diet.                        - Continue present medications.                        - Await pathology results.                        - Repeat colonoscopy for surveillance based on                         pathology results.                        -  Return to referring physician as previously                         scheduled.                        - The findings and recommendations were discussed with                         the patient. Procedure Code(s):     --- Professional ---                        [phone], Colonoscopy, flexible; with biopsy, single or                         multiple Diagnosis Code(s):     --- Professional ---                        Z12.11, Encounter for screening for malignant neoplasm                         of colon                        D12.3, Benign neoplasm of transverse colon (hepatic                         flexure or splenic flexure)                        D12.2, Benign neoplasm of ascending colon CPT copyright 2022 American Medical Association. All rights reserved. The codes documented in this report are preliminary and upon coder review may  be revised to meet current compliance requirements. Attending Participation:      I personally performed the entire procedure. Polo Brisk, DO Quintin Buckle DO, DO 04/08/2024 1:50:29 PM This report has been signed electronically. Number of Addenda: 0 Note Initiated On: 04/08/2024 1:08 PM Scope Withdrawal Time: 0 hours 10 minutes 50 seconds  Total Procedure Duration: 0 hours 14 minutes 16 seconds  Estimated Blood Loss:  Estimated blood loss was minimal.      [facility]

## 2024-04-08 NOTE — Anesthesia Postprocedure Evaluation (Signed)
 Anesthesia Post Note  Patient: Gwendlyn Lenaya Pietsch  Procedure(s) Performed: COLONOSCOPY EGD (ESOPHAGOGASTRODUODENOSCOPY)  Patient location during evaluation: PACU Anesthesia Type: General Level of consciousness: awake and alert, oriented and patient cooperative Pain management: pain level controlled Vital Signs Assessment: post-procedure vital signs reviewed and stable Respiratory status: spontaneous breathing, nonlabored ventilation and respiratory function stable Cardiovascular status: blood pressure returned to baseline and stable Postop Assessment: adequate PO intake Anesthetic complications: no   There were no known notable events for this encounter.   Last Vitals:  Vitals:   04/08/24 1347 04/08/24 1357  BP: (!) 99/56 (!) 109/58  Pulse: 64 (!) 58  Resp: 16 14  Temp: (!) 36 C   SpO2: 99% 99%    Last Pain:  Vitals:   04/08/24 1357  TempSrc:   PainSc: 0-No pain                 Dorothey Gate

## 2024-04-08 NOTE — Transfer of Care (Signed)
 Immediate Anesthesia Transfer of Care Note  Patient: Sherri Mason  Procedure(s) Performed: COLONOSCOPY EGD (ESOPHAGOGASTRODUODENOSCOPY)  Patient Location: PACU  Anesthesia Type:General  Level of Consciousness: drowsy and patient cooperative  Airway & Oxygen Therapy: Patient Spontanous Breathing and Patient connected to nasal cannula oxygen  Post-op Assessment: Report given to RN and Post -op Vital signs reviewed and stable  Post vital signs: stable  Last Vitals:  Vitals Value Taken Time  BP 99/56 04/08/24 1346  Temp    Pulse 64 04/08/24 1346  Resp 16 04/08/24 1346  SpO2 100 % 04/08/24 1346  Vitals shown include unfiled device data.  Last Pain:  Vitals:   04/08/24 1229  TempSrc: Temporal  PainSc: 0-No pain         Complications: No notable events documented.

## 2024-04-08 NOTE — H&P (Signed)
 Pre-Procedure H&P   Patient ID: Sherri Mason is a 68 y.o. female.  Gastroenterology Provider: Quintin Buckle, DO  Referring Provider: Laquetta Plank, PA PCP: Ardia Kraft, NP  Date: 04/08/2024  HPI Sherri Mason is a 68 y.o. female who presents today for Esophagogastroduodenoscopy and Colonoscopy for gerd, colorectal cancer screening; fhx gastric cancer .  Patient is a native [place] speaker so information is garnered via chart review and via medical video Spanish interpreter Gaylyn Keas [phone])  Patient reflux and symptoms if she does not take her PPI.  No dysphagia or odynophagia.  Family history of gastric cancer in her father.  Bowel movements regular without melena or hematochezia  Status post cholecystectomy  Hemoglobin 13.4 MCV 90 platelets 228,000 creatinine 0.53   Past Medical History:  Diagnosis Date   GERD (gastroesophageal reflux disease)    Hypertension    Irritable bowel syndrome    Major depressive disorder, recurrent episode (HCC)    Menopausal syndrome    Osteoarthritis    Trigger finger, right middle finger     Past Surgical History:  Procedure Laterality Date   CHOLECYSTECTOMY      Family History Father- gastric ca No other h/o GI disease or malignancy  Review of Systems  Constitutional:  Negative for activity change, appetite change, chills, diaphoresis, fatigue, fever and unexpected weight change.  HENT:  Negative for trouble swallowing and voice change.   Respiratory:  Negative for shortness of breath and wheezing.   Cardiovascular:  Negative for chest pain, palpitations and leg swelling.  Gastrointestinal:  Negative for abdominal distention, abdominal pain, anal bleeding, blood in stool, constipation, diarrhea, nausea, rectal pain and vomiting.  Musculoskeletal:  Negative for arthralgias and myalgias.  Skin:  Negative for color change and pallor.  Neurological:  Negative for dizziness, syncope and  weakness.  Psychiatric/Behavioral:  Negative for confusion.   All other systems reviewed and are negative.    Medications No current facility-administered medications on file prior to encounter.   Current Outpatient Medications on File Prior to Encounter  Medication Sig Dispense Refill   albuterol  (VENTOLIN  HFA) 108 (90 Base) MCG/ACT inhaler Inhale 1-2 puffs into the lungs every 4 (four) hours as needed for wheezing or shortness of breath.     benzonatate (TESSALON) 100 MG capsule Take 100 mg by mouth 3 (three) times daily.     dexamethasone  (DECADRON ) 6 MG tablet Take 1 tablet (6 mg total) by mouth daily. 5 tablet 0   acetaminophen  (TYLENOL ) 325 MG tablet Take 650 mg by mouth every 6 (six) hours as needed for fever.      Pertinent medications related to GI and procedure were reviewed by me with the patient prior to the procedure  No current facility-administered medications for this encounter.      Allergies  Allergen Reactions   Aspirin Other (See Comments)    Abdominal pain    Omeprazole Other (See Comments)    ABDOMINAL PAIN,HEADACHE   Allergies were reviewed by me prior to the procedure  Objective   Body mass index is 23.79 kg/m. Vitals:   04/08/24 1229  BP: (!) 147/80  Pulse: 66  Resp: 14  Temp: (!) 96.5 F (35.8 C)  TempSrc: Temporal  SpO2: 96%  Weight: 55.2 kg  Height: 5' (1.524 m)     Physical Exam Vitals and nursing note reviewed.  Constitutional:      General: She is not in acute distress.    Appearance: Normal appearance. She is not ill-appearing,  toxic-appearing or diaphoretic.  HENT:     Head: Normocephalic and atraumatic.     Nose: Nose normal.     Mouth/Throat:     Mouth: Mucous membranes are moist.     Pharynx: Oropharynx is clear.  Eyes:     General: No scleral icterus.    Extraocular Movements: Extraocular movements intact.  Cardiovascular:     Rate and Rhythm: Normal rate and regular rhythm.     Heart sounds: Normal heart sounds.  No murmur heard.    No friction rub. No gallop.  Pulmonary:     Effort: Pulmonary effort is normal. No respiratory distress.     Breath sounds: Normal breath sounds. No wheezing, rhonchi or rales.  Abdominal:     General: Bowel sounds are normal. There is no distension.     Palpations: Abdomen is soft.     Tenderness: There is no abdominal tenderness. There is no guarding or rebound.  Musculoskeletal:     Cervical back: Neck supple.     Right lower leg: No edema.     Left lower leg: No edema.  Skin:    General: Skin is warm and dry.     Coloration: Skin is not jaundiced or pale.  Neurological:     General: No focal deficit present.     Mental Status: She is alert and oriented to person, place, and time. Mental status is at baseline.  Psychiatric:        Mood and Affect: Mood normal.        Behavior: Behavior normal.        Thought Content: Thought content normal.        Judgment: Judgment normal.      Assessment:  Sherri Mason is a 68 y.o. female  who presents today for Esophagogastroduodenoscopy and Colonoscopy for gerd, colorectal cancer screening; fhx gastric cancer .  Plan:  Esophagogastroduodenoscopy and Colonoscopy with possible intervention today  Esophagogastroduodenoscopy and Colonoscopy with possible biopsy, control of bleeding, polypectomy, and interventions as necessary has been discussed with the patient/patient representative. Informed consent was obtained from the patient/patient representative after explaining the indication, nature, and risks of the procedure including but not limited to death, bleeding, perforation, missed neoplasm/lesions, cardiorespiratory compromise, and reaction to medications. Opportunity for questions was given and appropriate answers were provided. Patient/patient representative has verbalized understanding is amenable to undergoing the procedure.   Quintin Buckle, DO  [facility]  Portions  of the record may have been created with voice recognition software. Occasional wrong-word or 'sound-a-like' substitutions may have occurred due to the inherent limitations of voice recognition software.  Read the chart carefully and recognize, using context, where substitutions may have occurred.

## 2024-04-08 NOTE — Op Note (Signed)
 [facility] Patient Name: Sherri Mason Procedure Date: 04/08/2024 1:10 PM MRN: [id number] Account #: [id number] Date of Birth: 1956-05-06 Admit Type: Outpatient Age: 68 Room: [facility] ENDO ROOM 1 Gender: Female Note Status: Supervisor Override Instrument Name: Almyra Jain [id number] Procedure:             Upper GI endoscopy Indications:           Exclusion of gastro-esophageal reflux disease, Nausea Providers:             Bridgett Camps, DO Referring MD:          Willa Haring (Referring MD) Medicines:             Monitored Anesthesia Care Complications:         No immediate complications. Estimated blood loss:                         Minimal. Procedure:             Pre-Anesthesia Assessment:                        - Prior to the procedure, a History and Physical was                         performed, and patient medications and allergies were                         reviewed. The patient is competent. The risks and                         benefits of the procedure and the sedation options and                         risks were discussed with the patient. All questions                         were answered and informed consent was obtained.                         Patient identification and proposed procedure were                         verified by the physician, the nurse, the anesthetist                         and the technician in the endoscopy suite. Mental                         Status Examination: alert and oriented. Airway                         Examination: normal oropharyngeal airway and neck                         mobility. Respiratory Examination: clear to                         auscultation. CV Examination: RRR, no murmurs, no S3  or S4. Prophylactic Antibiotics: The patient does not                         require prophylactic antibiotics. Prior                         Anticoagulants:  The patient has taken no anticoagulant                         or antiplatelet agents. ASA Grade Assessment: II - A                         patient with mild systemic disease. After reviewing                         the risks and benefits, the patient was deemed in                         satisfactory condition to undergo the procedure. The                         anesthesia plan was to use monitored anesthesia care                         (MAC). Immediately prior to administration of                         medications, the patient was re-assessed for adequacy                         to receive sedatives. The heart rate, respiratory                         rate, oxygen saturations, blood pressure, adequacy of                         pulmonary ventilation, and response to care were                         monitored throughout the procedure. The physical                         status of the patient was re-assessed after the                         procedure.                        After obtaining informed consent, the endoscope was                         passed under direct vision. Throughout the procedure,                         the patient's blood pressure, pulse, and oxygen                         saturations were monitored continuously. The Endoscope  was introduced through the mouth, and advanced to the                         second part of duodenum. The upper GI endoscopy was                         accomplished without difficulty. The patient tolerated                         the procedure well. Findings:      Localized mild inflammation characterized by erythema was found in the       duodenal bulb. Estimated blood loss: none.      The exam of the duodenum was otherwise normal.      Four non-bleeding cratered gastric ulcers with a clean ulcer base       (Forrest Class III) were found in the gastric antrum. The largest lesion       was 3 mm in largest  dimension. Biopsies were taken with a cold forceps       for Helicobacter pylori testing. Estimated blood loss was minimal.      Localized moderate inflammation characterized by erosions and erythema       was found in the gastric antrum. Estimated blood loss: none.      The exam of the stomach was otherwise normal.      The Z-line was regular. Estimated blood loss: none.      The examined esophagus was normal. Estimated blood loss: none.      Esophagogastric landmarks were identified: the gastroesophageal junction       was found at 35 cm from the incisors. Impression:            - Duodenitis.                        - Non-bleeding gastric ulcers with a clean ulcer base                         (Forrest Class III). Biopsied.                        - Gastritis.                        - Z-line regular.                        - Normal esophagus.                        - Esophagogastric landmarks identified.                        -                        - Duodenitis.                        - ?lceras g?stricas no sangrantes con base limpia                         (Forrest Clase III). Biopsiada.                        -  Gastritis.                        - L?nea Z normal.                        - Es?fago normal.                        - Puntos de referencia esofagog?stricos identificados. Recommendation:        - Patient has a contact number available for                         emergencies. The signs and symptoms of potential                         delayed complications were discussed with the patient.                         Return to normal activities tomorrow. Written                         discharge instructions were provided to the patient.                        - Discharge patient to home.                        - Resume previous diet.                        - Continue present medications.                        - No ibuprofen, naproxen , or other non-steroidal                          anti-inflammatory drugs.                        - Increase proton pump inhibitor to twice a day for 8                         weeks. A new prescription will be sent to your                         pharmacy which can be picked up this weekend.                        - Return to GI clinic as previously scheduled.                        - The findings and recommendations were discussed with                         the patient. Procedure Code(s):     --- Professional ---                        [phone], Esophagogastroduodenoscopy, flexible,  transoral; with biopsy, single or multiple Diagnosis Code(s):     --- Professional ---                        K29.80, Duodenitis without bleeding                        K25.9, Gastric ulcer, unspecified as acute or chronic,                         without hemorrhage or perforation                        K29.70, Gastritis, unspecified, without bleeding                        R10.13, Epigastric pain CPT copyright 2022 American [employer]. All rights reserved. The codes documented in this report are preliminary and upon coder review may  be revised to meet current compliance requirements. Attending Participation:      I personally performed the entire procedure. Polo Brisk, DO Quintin Buckle DO, DO 04/08/2024 1:56:11 PM This report has been signed electronically. Number of Addenda: 0 Note Initiated On: 04/08/2024 1:10 PM Estimated Blood Loss:  Estimated blood loss was minimal.      [facility]

## 2024-04-09 ENCOUNTER — Encounter: Payer: Self-pay | Admitting: Gastroenterology

## 2024-04-09 LAB — SURGICAL PATHOLOGY

## 2024-04-26 ENCOUNTER — Other Ambulatory Visit: Payer: Self-pay | Admitting: Primary Care

## 2024-04-26 DIAGNOSIS — R1024 Suprapubic pain: Secondary | ICD-10-CM

## 2024-04-26 DIAGNOSIS — R300 Dysuria: Secondary | ICD-10-CM

## 2024-04-26 DIAGNOSIS — R102 Pelvic and perineal pain: Secondary | ICD-10-CM

## 2024-04-26 DIAGNOSIS — R350 Frequency of micturition: Secondary | ICD-10-CM

## 2024-04-26 DIAGNOSIS — R109 Unspecified abdominal pain: Secondary | ICD-10-CM

## 2024-04-27 ENCOUNTER — Other Ambulatory Visit: Payer: Self-pay | Admitting: Primary Care

## 2024-04-27 DIAGNOSIS — R102 Pelvic and perineal pain: Secondary | ICD-10-CM

## 2024-04-27 DIAGNOSIS — R350 Frequency of micturition: Secondary | ICD-10-CM

## 2024-04-27 DIAGNOSIS — R1024 Suprapubic pain: Secondary | ICD-10-CM

## 2024-04-27 DIAGNOSIS — R300 Dysuria: Secondary | ICD-10-CM

## 2024-04-27 DIAGNOSIS — R109 Unspecified abdominal pain: Secondary | ICD-10-CM

## 2024-04-28 ENCOUNTER — Ambulatory Visit
Admission: RE | Admit: 2024-04-28 | Discharge: 2024-04-28 | Disposition: A | Discharge status: Home / Self Care | Source: Ambulatory Visit | Attending: Primary Care | Admitting: Primary Care

## 2024-04-28 DIAGNOSIS — R300 Dysuria: Secondary | ICD-10-CM | POA: Diagnosis present

## 2024-04-28 DIAGNOSIS — R109 Unspecified abdominal pain: Secondary | ICD-10-CM | POA: Diagnosis present

## 2024-04-28 DIAGNOSIS — R350 Frequency of micturition: Secondary | ICD-10-CM | POA: Diagnosis present

## 2024-04-28 DIAGNOSIS — R102 Pelvic and perineal pain: Secondary | ICD-10-CM | POA: Insufficient documentation

## 2024-05-13 LAB — GLUCOSE, POCT (MANUAL RESULT ENTRY): POC Glucose: 136 mg/dL — AB (ref 70–99)

## 2024-05-13 NOTE — Congregational Nurse Program (Signed)
  Dept: [phone]   Congregational Nurse Program Note  Date of Encounter: 05/13/2024  Past Medical History: Past Medical History:  Diagnosis Date   GERD (gastroesophageal reflux disease)    Hypertension    Irritable bowel syndrome    Major depressive disorder, recurrent episode (HCC)    Menopausal syndrome    Osteoarthritis    Trigger finger, right middle finger     Encounter Details:  Community Questionnaire - 05/13/24 2028       Questionnaire   Ask client: Do you give verbal consent for me to treat you today? Yes    Student Assistance N/A    Location Patient Served  [place]    Encounter Setting CN site    Population Status Unknown    Insurance Uninsured ([employer]/Self-Pay/Medicaid Family Planning)    Insurance/Financial Assistance Referral N/A    Medication N/A    Medical Provider No    Screening Referrals Made N/A    Medical Referrals Made N/A    Medical Appointment Completed N/A    CNP Interventions Advocate/Support    Screenings CN Performed Blood Pressure;Blood Glucose    ED Visit Averted N/A    Life-Saving Intervention Made N/A         Today's Vitals   05/13/24 2026  BP: (!) 140/80   There is no height or weight on file to calculate BMI.  Patient given handout for know your numbers (spanish version) and counseled on eating more fresh and frozen vegetables rather than canned vegetables.  Interpreter from [employer] assisted with this explanation. Patient acknowledged understanding.

## 2024-11-01 ENCOUNTER — Other Ambulatory Visit: Payer: Self-pay | Admitting: Gastroenterology

## 2024-11-01 DIAGNOSIS — R1013 Epigastric pain: Secondary | ICD-10-CM

## 2024-12-02 ENCOUNTER — Ambulatory Visit
Admission: RE | Admit: 2024-12-02 | Discharge: 2024-12-02 | Disposition: A | Discharge status: Home / Self Care | Source: Ambulatory Visit | Attending: Gastroenterology | Admitting: Gastroenterology

## 2024-12-02 DIAGNOSIS — R1013 Epigastric pain: Secondary | ICD-10-CM | POA: Insufficient documentation

## 2024-12-02 MED ORDER — IOHEXOL 300 MG/ML  SOLN
100.0000 mL | Freq: Once | INTRAMUSCULAR | Status: AC | PRN
  Administered 2024-12-02: 100 mL via INTRAVENOUS
